# Patient Record
Sex: Female | Born: 1995 | Race: Black or African American | Hispanic: No | Marital: Single | Smoking: Never smoker
Health system: Southern US, Community
[De-identification: ages and names within clinical notes are randomized; demographics above are authoritative.]

## PROBLEM LIST (undated history)

## (undated) HISTORY — PX: DILATION AND CURETTAGE OF UTERUS: SHX78

---
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
## 2017-05-03 NOTE — L&D Delivery Note (Addendum)
  At  a viable female was delivered via .  Presentation: vertex; Position: Left,, Occiput,, Anterior;  Delivery of the head: 1443  ,  suprapubic pressure   APGAR:8/9 , ; weight  .  Pending   Dr. Dion BodyVarnado in the room for the rest of the delivery including for placenta and repair.   Upon my arrival, pt was doing well, comfortable.  Holding crying baby boy. Placenta status: intact, spontaneous .   Cord:  with the following complications:NOne .  Cord pH: NA  Anesthesia:  Lidocaine less then 20 ml Episiotomy:  None Lacerations:  3rd degree Suture Repair: 0.0 Vicryl, 2.0/3.0 Chromic Est. Blood Loss (mL):  See note.  Less than 500  3rd degree laceration repaired with Allis clamps and a series of interrupted sutures.  Vaginal repair done in 2 layers.  Digital rectal exam done before and after repair was normal.  No mucosal interruption.  Mom to postpartum.  Baby to Couplet care / Skin to Skin.  Thressa ShellerHeather Hogan 10/28/2017, 3:41 PM   Cosigned Geryl RankinsEvelyn Lyrical Sowle, MD 11/07/2017, 2:10 PM

## 2017-10-28 ENCOUNTER — Encounter (HOSPITAL_COMMUNITY): Payer: Self-pay | Admitting: *Deleted

## 2017-10-28 ENCOUNTER — Inpatient Hospital Stay (HOSPITAL_COMMUNITY)
Admission: AD | Admit: 2017-10-28 | Discharge: 2017-10-30 | DRG: 768 | Disposition: A | Discharge status: Home / Self Care | Payer: 59 | Source: Ambulatory Visit | Attending: Obstetrics and Gynecology | Admitting: Obstetrics and Gynecology

## 2017-10-28 ENCOUNTER — Other Ambulatory Visit: Payer: Self-pay

## 2017-10-28 DIAGNOSIS — O9902 Anemia complicating childbirth: Secondary | ICD-10-CM | POA: Diagnosis not present

## 2017-10-28 DIAGNOSIS — Z3403 Encounter for supervision of normal first pregnancy, third trimester: Secondary | ICD-10-CM | POA: Diagnosis present

## 2017-10-28 DIAGNOSIS — D649 Anemia, unspecified: Secondary | ICD-10-CM | POA: Diagnosis not present

## 2017-10-28 DIAGNOSIS — Z3A38 38 weeks gestation of pregnancy: Secondary | ICD-10-CM

## 2017-10-28 LAB — CBC
HEMATOCRIT: 40.5 % (ref 36.0–46.0)
Hemoglobin: 13.3 g/dL (ref 12.0–15.0)
MCH: 30.9 pg (ref 26.0–34.0)
MCHC: 32.8 g/dL (ref 30.0–36.0)
MCV: 94 fL (ref 78.0–100.0)
PLATELETS: 185 10*3/uL (ref 150–400)
RBC: 4.31 MIL/uL (ref 3.87–5.11)
RDW: 13.8 % (ref 11.5–15.5)
WBC: 10.3 10*3/uL (ref 4.0–10.5)

## 2017-10-28 LAB — TYPE AND SCREEN
ABO/RH(D): O POS
ANTIBODY SCREEN: NEGATIVE

## 2017-10-28 LAB — ABO/RH: ABO/RH(D): O POS

## 2017-10-28 MED ORDER — SENNOSIDES-DOCUSATE SODIUM 8.6-50 MG PO TABS
2.0000 | ORAL_TABLET | ORAL | Status: DC
  Administered 2017-10-28 – 2017-10-29 (×2): 2 via ORAL
  Filled 2017-10-28 (×2): qty 2

## 2017-10-28 MED ORDER — FENTANYL 2.5 MCG/ML BUPIVACAINE 1/10 % EPIDURAL INFUSION (WH - ANES)
14.0000 mL/h | INTRAMUSCULAR | Status: DC | PRN
  Filled 2017-10-28: qty 100

## 2017-10-28 MED ORDER — LACTATED RINGERS IV SOLN: 500.0000 mL | Freq: Once | INTRAVENOUS | Status: DC

## 2017-10-28 MED ORDER — EPHEDRINE 5 MG/ML INJ
10.0000 mg | INTRAVENOUS | Status: DC | PRN
  Filled 2017-10-28: qty 2

## 2017-10-28 MED ORDER — ZOLPIDEM TARTRATE 5 MG PO TABS: 5.0000 mg | ORAL_TABLET | Freq: Every evening | ORAL | Status: DC | PRN

## 2017-10-28 MED ORDER — OXYCODONE HCL 5 MG PO TABS
5.0000 mg | ORAL_TABLET | ORAL | Status: DC | PRN
  Administered 2017-10-29 (×3): 5 mg via ORAL
  Filled 2017-10-28 (×3): qty 1

## 2017-10-28 MED ORDER — BENZOCAINE-MENTHOL 20-0.5 % EX AERO
1.0000 "application " | INHALATION_SPRAY | CUTANEOUS | Status: DC | PRN
  Administered 2017-10-28: 1 via TOPICAL
  Filled 2017-10-28 (×2): qty 56

## 2017-10-28 MED ORDER — OXYTOCIN BOLUS FROM INFUSION
500.0000 mL | Freq: Once | INTRAVENOUS | Status: AC
  Administered 2017-10-28: 500 mL via INTRAVENOUS

## 2017-10-28 MED ORDER — ACETAMINOPHEN 325 MG PO TABS: 650.0000 mg | ORAL_TABLET | ORAL | Status: DC | PRN

## 2017-10-28 MED ORDER — ONDANSETRON HCL 4 MG/2ML IJ SOLN: 4.0000 mg | Freq: Four times a day (QID) | INTRAMUSCULAR | Status: DC | PRN

## 2017-10-28 MED ORDER — ALBUTEROL SULFATE (2.5 MG/3ML) 0.083% IN NEBU: 3.0000 mL | INHALATION_SOLUTION | Freq: Four times a day (QID) | RESPIRATORY_TRACT | Status: DC | PRN

## 2017-10-28 MED ORDER — LACTATED RINGERS IV SOLN
INTRAVENOUS | Status: DC
  Administered 2017-10-28: 13:00:00 via INTRAVENOUS

## 2017-10-28 MED ORDER — COCONUT OIL OIL: 1.0000 "application " | TOPICAL_OIL | Status: DC | PRN

## 2017-10-28 MED ORDER — TETANUS-DIPHTH-ACELL PERTUSSIS 5-2.5-18.5 LF-MCG/0.5 IM SUSP: 0.5000 mL | Freq: Once | INTRAMUSCULAR | Status: DC

## 2017-10-28 MED ORDER — PHENYLEPHRINE 40 MCG/ML (10ML) SYRINGE FOR IV PUSH (FOR BLOOD PRESSURE SUPPORT)
80.0000 ug | PREFILLED_SYRINGE | INTRAVENOUS | Status: DC | PRN
  Filled 2017-10-28: qty 5

## 2017-10-28 MED ORDER — PHENYLEPHRINE 40 MCG/ML (10ML) SYRINGE FOR IV PUSH (FOR BLOOD PRESSURE SUPPORT)
80.0000 ug | PREFILLED_SYRINGE | INTRAVENOUS | Status: DC | PRN
  Filled 2017-10-28: qty 10
  Filled 2017-10-28: qty 5

## 2017-10-28 MED ORDER — METHYLERGONOVINE MALEATE 0.2 MG PO TABS: 0.2000 mg | ORAL_TABLET | ORAL | Status: DC | PRN

## 2017-10-28 MED ORDER — LACTATED RINGERS IV SOLN: 500.0000 mL | INTRAVENOUS | Status: DC | PRN

## 2017-10-28 MED ORDER — DIBUCAINE 1 % RE OINT: 1.0000 "application " | TOPICAL_OINTMENT | RECTAL | Status: DC | PRN

## 2017-10-28 MED ORDER — OXYCODONE-ACETAMINOPHEN 5-325 MG PO TABS: 2.0000 | ORAL_TABLET | ORAL | Status: DC | PRN

## 2017-10-28 MED ORDER — METHYLERGONOVINE MALEATE 0.2 MG/ML IJ SOLN: 0.2000 mg | INTRAMUSCULAR | Status: DC | PRN

## 2017-10-28 MED ORDER — DIPHENHYDRAMINE HCL 50 MG/ML IJ SOLN: 12.5000 mg | INTRAMUSCULAR | Status: DC | PRN

## 2017-10-28 MED ORDER — DIPHENHYDRAMINE HCL 25 MG PO CAPS: 25.0000 mg | ORAL_CAPSULE | Freq: Four times a day (QID) | ORAL | Status: DC | PRN

## 2017-10-28 MED ORDER — FENTANYL CITRATE (PF) 100 MCG/2ML IJ SOLN
50.0000 ug | INTRAMUSCULAR | Status: DC | PRN
  Administered 2017-10-28: 50 ug via INTRAVENOUS
  Filled 2017-10-28: qty 2

## 2017-10-28 MED ORDER — OXYTOCIN 40 UNITS IN LACTATED RINGERS INFUSION - SIMPLE MED
2.5000 [IU]/h | INTRAVENOUS | Status: DC
  Filled 2017-10-28: qty 1000

## 2017-10-28 MED ORDER — SOD CITRATE-CITRIC ACID 500-334 MG/5ML PO SOLN: 30.0000 mL | ORAL | Status: DC | PRN

## 2017-10-28 MED ORDER — WITCH HAZEL-GLYCERIN EX PADS: 1.0000 "application " | MEDICATED_PAD | CUTANEOUS | Status: DC | PRN

## 2017-10-28 MED ORDER — OXYCODONE HCL 5 MG PO TABS: 10.0000 mg | ORAL_TABLET | ORAL | Status: DC | PRN

## 2017-10-28 MED ORDER — MAGNESIUM HYDROXIDE 400 MG/5ML PO SUSP: 30.0000 mL | ORAL | Status: DC | PRN

## 2017-10-28 MED ORDER — IBUPROFEN 600 MG PO TABS
600.0000 mg | ORAL_TABLET | Freq: Four times a day (QID) | ORAL | Status: DC
  Administered 2017-10-28 – 2017-10-30 (×7): 600 mg via ORAL
  Filled 2017-10-28 (×7): qty 1

## 2017-10-28 MED ORDER — ONDANSETRON HCL 4 MG/2ML IJ SOLN: 4.0000 mg | INTRAMUSCULAR | Status: DC | PRN

## 2017-10-28 MED ORDER — ACETAMINOPHEN 325 MG PO TABS
650.0000 mg | ORAL_TABLET | ORAL | Status: DC | PRN
  Administered 2017-10-28 – 2017-10-29 (×3): 650 mg via ORAL
  Filled 2017-10-28 (×3): qty 2

## 2017-10-28 MED ORDER — LIDOCAINE HCL (PF) 1 % IJ SOLN
30.0000 mL | INTRAMUSCULAR | Status: DC | PRN
  Filled 2017-10-28: qty 30

## 2017-10-28 MED ORDER — OXYCODONE-ACETAMINOPHEN 5-325 MG PO TABS
1.0000 | ORAL_TABLET | ORAL | Status: DC | PRN
  Administered 2017-10-28: 1 via ORAL
  Filled 2017-10-28: qty 1

## 2017-10-28 MED ORDER — PRENATAL MULTIVITAMIN CH
1.0000 | ORAL_TABLET | Freq: Every day | ORAL | Status: DC
  Administered 2017-10-29 – 2017-10-30 (×2): 1 via ORAL
  Filled 2017-10-28: qty 1

## 2017-10-28 MED ORDER — FLEET ENEMA 7-19 GM/118ML RE ENEM: 1.0000 | ENEMA | RECTAL | Status: DC | PRN

## 2017-10-28 MED ORDER — ONDANSETRON HCL 4 MG PO TABS: 4.0000 mg | ORAL_TABLET | ORAL | Status: DC | PRN

## 2017-10-28 MED ORDER — SIMETHICONE 80 MG PO CHEW: 80.0000 mg | CHEWABLE_TABLET | ORAL | Status: DC | PRN

## 2017-10-28 MED ORDER — BUTORPHANOL TARTRATE 1 MG/ML IJ SOLN
1.0000 mg | INTRAMUSCULAR | Status: DC | PRN
  Administered 2017-10-28: 1 mg via INTRAVENOUS
  Filled 2017-10-28: qty 1

## 2017-10-28 NOTE — H&P (Signed)
Gloria Estes is a 22 y.o. female presenting for  Labor. Pt presented in labor.  [facility]  Reports pain as severe.  PNC with [facility] (Dr. Charlotta Newtonzan) has been uncomplicated.  OB History    Gravida  1   Para      Term      Preterm      AB      Living        SAB      TAB      Ectopic      Multiple      Live Births             History reviewed. No pertinent past medical history. History reviewed. No pertinent surgical history. Family History: family history is not on file. Social History:  reports that she has never smoked. She has never used smokeless tobacco. She reports that she does not drink alcohol. Her drug history is not on file.  ROS  Abdominal pain. No bleeding  History Dilation: 1.5 Effacement (%): 70 Station: -2 Exam by:: Vira BlancoShimica Robinson RN  Blood pressure 112/69, pulse 71, temperature 97.6 F (36.4 C), temperature source Oral, resp. rate 18, weight 64.9 kg (143 lb), SpO2 98 %. Exam  Cat I tracing irregular contractions  Physical Exam  Pt with moderate to severe pain with contractions. Abd Gravid, nontender.  leopolds less than 7 pounds. Ext:  No calf tenderness Cervix deferred.  Prenatal labs: ABO, Rh:   Antibody:   Rubella:   RPR:    HBsAg:    HIV:    GBS:     Assessment/Plan: IUP @ term Active labor Epidural per anesthesia upon pt request. Expectant management.   Geryl Rankinsvelyn Larron Armor 10/28/2017, 1:03 PM

## 2017-10-28 NOTE — Progress Notes (Signed)
Phone call to Dr Idamae SchullerVarnardo.  Notification of pt presenting complaint. G1/SROM/38.4/ 1cm.  Orders for admission to birthing suites

## 2017-10-28 NOTE — MAU Note (Signed)
Started leaking clear fluid around 10, still coming. No bleeding.little blood in with mucous the morning.   Is contracting.  Was closed when last checked.

## 2017-10-29 LAB — CBC
HCT: 29.3 % — ABNORMAL LOW (ref 36.0–46.0)
Hemoglobin: 10 g/dL — ABNORMAL LOW (ref 12.0–15.0)
MCH: 31.6 pg (ref 26.0–34.0)
MCHC: 34.1 g/dL (ref 30.0–36.0)
MCV: 92.7 fL (ref 78.0–100.0)
PLATELETS: 171 10*3/uL (ref 150–400)
RBC: 3.16 MIL/uL — AB (ref 3.87–5.11)
RDW: 13.7 % (ref 11.5–15.5)
WBC: 13.4 10*3/uL — ABNORMAL HIGH (ref 4.0–10.5)

## 2017-10-29 LAB — RPR: RPR Ser Ql: NONREACTIVE

## 2017-10-29 NOTE — Lactation Note (Signed)
This note was copied from a baby's chart. Lactation Consultation Note Baby 13 hrs old. Baby latched onto breast in cradle position.good latch noted. Wide flange. Encouraged mom to have cheeks to breast. Mom stated that she has trouble latching to her Rt. Breast.  Mom has cone shaped breast w/bulbous areola and everted nipples.  Asked mom if she would like LC to assist to other breast, mom stated yes. Demonstrated unlatching, noted everted lip stick shape nipple. Assisted to Rt. Breast. Noted Rt. Breast tissue slightly less than Lt. Both has colostrum w/hand expression. Mom stated her hand gets tired of holding baby's head. Placed folded baby blanket under hand.  Discussed body alignment, positions, support, breast massage, STS, I&O, cluster feeding, supply and demand. Newborn behavior discussed. Mom encouraged to feed baby 8-12 times/24 hours and with feeding cues.  Encouraged to call for assistance or questions.  WH/LC brochure given w/resources, support groups and LC services.  Patient Name: Gloria Kathyrn DrownRicki Kunda ZOXWR'UToday's Date: 10/29/2017 Reason for consult: Initial assessment;Early term 37-38.6wks   Maternal Data Has patient been taught Hand Expression?: Yes Does the patient have breastfeeding experience prior to this delivery?: No  Feeding Feeding Type: Breast Fed Length of feed: 15 min(still BF)  LATCH Score Latch: Grasps breast easily, tongue down, lips flanged, rhythmical sucking.  Audible Swallowing: Spontaneous and intermittent  Type of Nipple: Everted at rest and after stimulation  Comfort (Breast/Nipple): Soft / non-tender  Hold (Positioning): Assistance needed to correctly position infant at breast and maintain latch.  LATCH Score: 9  Interventions Interventions: Breast feeding basics reviewed;Support pillows;Assisted with latch;Position options;Skin to skin;Breast massage;Hand express;Breast compression;Adjust position  Lactation Tools Discussed/Used WIC Program:  Yes([place]   Consult Status Consult Status: Follow-up Date: 10/30/17 Follow-up type: In-patient    Charyl DancerCARVER, Sincerity Cedar G 10/29/2017, 4:31 AM

## 2017-10-29 NOTE — Progress Notes (Signed)
Post Partum Day 1 Subjective: no complaints, up ad lib, voiding, tolerating PO and breast feeding.    Mother and baby are bonding well. Patient reports bleeding is minimal  Objective: Blood pressure 103/67, pulse (!) 58, temperature 97.8 F (36.6 C), temperature source Oral, resp. rate 18, weight 143 lb (64.9 kg), SpO2 100 %.  Physical Exam:  General: alert, cooperative and no distress Lochia: appropriate Uterine Fundus: firm Perineum:  healing well DVT Evaluation: No evidence of DVT seen on physical exam. Negative Homan's sign.  Recent Labs    10/28/17 1237 10/29/17 0523  HGB 13.3 10.0*  HCT 40.5 29.3*    Assessment/Plan: Plan for discharge tomorrow, Breastfeeding and Circumcision done today Continue routine post partum care.    LOS: 1 day   Dionisia Pacholski STACIA 10/29/2017, 9:24 AM

## 2017-10-30 MED ORDER — FERROUS SULFATE 325 (65 FE) MG PO TABS: 325.0000 mg | ORAL_TABLET | Freq: Two times a day (BID) | ORAL | 3 refills | Status: DC

## 2017-10-30 MED ORDER — IBUPROFEN 600 MG PO TABS: 600.0000 mg | ORAL_TABLET | Freq: Four times a day (QID) | ORAL | 0 refills | Status: DC

## 2017-10-30 NOTE — Lactation Note (Signed)
This note was copied from a baby's chart. Lactation Consultation Note  Patient Name: Gloria Kathyrn DrownRicki Estes ZOXWR'UToday's Date: 10/30/2017    P1, Baby 44 hours old and had large stool this morning. Baby at the end of feeding [facility] for 10 min. Attempted latching in cross cradle hold but baby would not wake.  Reviewed hand expression with mother w/ good flow of breastmilk. Mom encouraged to feed baby 8-12 times/24 hours and with feeding cues.  Reviewed waking techniques.  Encouraged mother to breastfeed on both breasts per feeding if baby is willing. Reviewed engorgement care and monitoring voids/stools.      Maternal Data    Feeding Feeding Type: Breast Fed Length of feed: 10 min  LATCH Score                   Interventions    Lactation Tools Discussed/Used     Consult Status      Hardie PulleyBerkelhammer, Gloria Estes 10/30/2017, 11:02 AM

## 2017-10-30 NOTE — Lactation Note (Signed)
This note was copied from a baby's chart. Lactation Consultation Note Baby 39 hrs old had several voids but no stool. Baby is cluster feeding, mom's breast starting to fill. Mom can easily express colostrum. Baby has circumcision yesterday. LC massage abd. To try to get baby to has stool. Nothing noted. Mom stated baby is fussy, passing some gas.  Encouraged breast massage during feeding.  Discussed feeding positions, filling, milk storage, engorgement, prevention, supply and demand. Explained to mom baby has to have stool before d/c home.   Patient Name: Gloria Estes WUJWJ'XToday's Date: 10/30/2017 Reason for consult: Follow-up assessment;Early term 37-38.6wks   Maternal Data    Feeding Feeding Type: Breast Fed Length of feed: 30 min  LATCH Score Latch: Grasps breast easily, tongue down, lips flanged, rhythmical sucking.  Audible Swallowing: Spontaneous and intermittent  Type of Nipple: Everted at rest and after stimulation  Comfort (Breast/Nipple): Soft / non-tender  Hold (Positioning): No assistance needed to correctly position infant at breast.  LATCH Score: 10  Interventions Interventions: Breast feeding basics reviewed;Support pillows;Breast massage  Lactation Tools Discussed/Used     Consult Status Consult Status: Follow-up Date: 10/30/17 Follow-up type: In-patient    Gloria Estes, Diamond NickelLAURA Estes 10/30/2017, 5:50 AM

## 2017-10-30 NOTE — Discharge Summary (Signed)
SVD OB Discharge Summary [facility]      Patient Name: Gloria Estes DOB: 1996/04/26 MRN: [id number]  Date of admission: 10/28/2017 Delivering MD: Thressa Sheller D  Date of delivery: 10/28/2017 Type of delivery: SVD  Newborn Data: Sex: BB Name: ??? Circumcision: Done Live born female  Birth Weight: 7 lb 9.3 oz (3440 g) APGAR: 8, 9  Newborn Delivery   Birth date/time:  10/28/2017 14:44:00 Delivery type:  Vaginal, Spontaneous     Feeding: breast Infant being discharge to home with mother in stable condition.   Admitting diagnosis: LABOR Intrauterine pregnancy: [redacted]w[redacted]d     Secondary diagnosis:  Active Problems:   Normal labor   Normal postpartum course                                Complications: None                                                              Intrapartum Procedures: spontaneous vaginal delivery and Supra pubic pressure used to deliver shoulder Postpartum Procedures: anemia Complications-Operative and Postpartum: 3rd degree perineal laceration repaired with local anesthesia  Augmentation: none   History of Present Illness: [name] is a 22 y.o. female, G1P0, who presents at [redacted]w[redacted]d weeks gestation. The patient has been followed at  [facility]  Her pregnancy has been complicated by: Patient Active Problem List   Diagnosis Date Noted  . Normal postpartum course 10/30/2017  . Normal labor 10/28/2017  Anemia  Hospital course:  Onset of Labor With Vaginal Delivery     22 y.o. yo G1P0 at [redacted]w[redacted]d was admitted in Latent Labor on 10/28/2017. Patient had an uncomplicated labor course as follows:  Membrane Rupture Time/Date: 9:45 AM ,10/28/2017   Intrapartum Procedures: Episiotomy: None [1]                                         Lacerations:  3rd degree [4]  Patient had a delivery of a Viable infant. 10/28/2017  Information for the patient's newborn:  Kamilah, Correia [[id number]]  Delivery Method: Vag-Spont     Pateint had an uncomplicated postpartum course.  She is ambulating, tolerating a regular diet, passing flatus, and urinating well. Patient is discharged home in stable condition on 10/30/17.  Postpartum Day # 2 : S/P NSVD due to Prom then spontaneous labor. Patient up ad lib, denies syncope or dizziness. Reports consuming regular diet without issues and denies N/V. Patient reports 0 bowel movement + passing flatus.  Denies issues with urination and reports bleeding is "light."  Patient is Breastfeeding and reports going well.  Desires undecided for postpartum contraception.  Pain is being appropriately managed with use of motrin.   Physical exam  Vitals:   10/29/17 0501 10/29/17 1549 10/29/17 2321 10/30/17 0633  BP: 103/67 121/72 116/65 (!) 103/51  Pulse: (!) 58 78 66 (!) 55  Resp: 18 18  18   Temp: 97.8 F (36.6 C) (!) 97.5 F (36.4 C) 98.6 F (37 C) 98.6 F (37 C)  TempSrc: Oral Oral Oral Oral  SpO2:  Weight:       General: alert, cooperative and no distress Lochia: appropriate Uterine Fundus: firm Perineum: 3rd degree, approximate, no erythema, healing well, no drainage DVT Evaluation: No evidence of DVT seen on physical exam. Negative Homan's sign. No cords or calf tenderness. No significant calf/ankle edema.  Labs: Lab Results  Component Value Date   WBC 13.4 (H) 10/29/2017   HGB 10.0 (L) 10/29/2017   HCT 29.3 (L) 10/29/2017   MCV 92.7 10/29/2017   PLT 171 10/29/2017   No flowsheet data found.  Date of discharge: 10/30/2017 Discharge Diagnoses: Term Pregnancy-delivered and anemia Discharge instruction: per After Visit Summary and "Baby and Me Booklet".  After visit meds:  Allergies as of 10/30/2017   No Known Allergies     Medication List    TAKE these medications   albuterol 108 (90 Base) MCG/ACT inhaler Commonly known as:  PROVENTIL HFA;VENTOLIN HFA Inhale 1 puff into the lungs every 6 (six) hours as needed for wheezing or shortness of breath.    ferrous fumarate 325 (106 Fe) MG Tabs tablet Commonly known as:  HEMOCYTE - 106 mg FE Take 1 tablet by mouth daily.   ferrous sulfate 325 (65 FE) MG tablet Take 1 tablet (325 mg total) by mouth 2 (two) times daily with a meal.   ibuprofen 600 MG tablet Commonly known as:  ADVIL,MOTRIN Take 1 tablet (600 mg total) by mouth every 6 (six) hours.   methocarbamol 500 MG tablet Commonly known as:  ROBAXIN Take 2 tablets (1,000 mg total) by mouth 4 (four) times daily as needed (Pain).       Activity:           pelvic rest Advance as tolerated. Pelvic rest for 6 weeks.  Diet:                routine Medications: PNV, Ibuprofen, Colace and Iron Postpartum contraception: Undecided Condition:  Pt discharge to home with baby in stable Anemia: Take Iron BID  Meds: Allergies as of 10/30/2017   No Known Allergies     Medication List    TAKE these medications   albuterol 108 (90 Base) MCG/ACT inhaler Commonly known as:  PROVENTIL HFA;VENTOLIN HFA Inhale 1 puff into the lungs every 6 (six) hours as needed for wheezing or shortness of breath.   ferrous fumarate 325 (106 Fe) MG Tabs tablet Commonly known as:  HEMOCYTE - 106 mg FE Take 1 tablet by mouth daily.   ferrous sulfate 325 (65 FE) MG tablet Take 1 tablet (325 mg total) by mouth 2 (two) times daily with a meal.   ibuprofen 600 MG tablet Commonly known as:  ADVIL,MOTRIN Take 1 tablet (600 mg total) by mouth every 6 (six) hours.   methocarbamol 500 MG tablet Commonly known as:  ROBAXIN Take 2 tablets (1,000 mg total) by mouth 4 (four) times daily as needed (Pain).       Discharge Follow Up:  Follow-up Information    [facility] Follow up in 6 week(s).   Specialty:  Obstetrics and Gynecology Contact information: [address] [place] [phone]           New WellsJade Gaelle Adriance, NP-C, CNM 10/30/2017, 11:19 AM  Dale DurhamMONTANA, Xara Paulding, FNP

## 2018-01-30 ENCOUNTER — Encounter (HOSPITAL_COMMUNITY): Payer: Self-pay

## 2019-03-20 ENCOUNTER — Encounter: Payer: Self-pay | Admitting: Emergency Medicine

## 2019-03-20 ENCOUNTER — Ambulatory Visit
Admission: EM | Admit: 2019-03-20 | Discharge: 2019-03-20 | Disposition: A | Discharge status: Home / Self Care | Payer: 59 | Attending: Physician Assistant | Admitting: Physician Assistant

## 2019-03-20 ENCOUNTER — Other Ambulatory Visit: Payer: Self-pay

## 2019-03-20 DIAGNOSIS — U071 COVID-19: Secondary | ICD-10-CM | POA: Diagnosis not present

## 2019-03-20 DIAGNOSIS — R112 Nausea with vomiting, unspecified: Secondary | ICD-10-CM | POA: Diagnosis not present

## 2019-03-20 LAB — POC SARS CORONAVIRUS 2 AG -  ED: SARS Coronavirus 2 Ag: POSITIVE — AB

## 2019-03-20 LAB — POCT INFLUENZA A/B
Influenza A, POC: NEGATIVE
Influenza B, POC: NEGATIVE

## 2019-03-20 MED ORDER — ONDANSETRON 4 MG PO TBDP: 4.0000 mg | ORAL_TABLET | Freq: Three times a day (TID) | ORAL | 0 refills | Status: DC | PRN

## 2019-03-20 MED ORDER — SODIUM CHLORIDE 0.9 % IV BOLUS
1000.0000 mL | Freq: Once | INTRAVENOUS | Status: AC
  Administered 2019-03-20: 19:00:00 1000 mL via INTRAVENOUS

## 2019-03-20 MED ORDER — ONDANSETRON HCL 4 MG/2ML IJ SOLN
4.0000 mg | Freq: Once | INTRAMUSCULAR | Status: AC
  Administered 2019-03-20: 19:00:00 4 mg via INTRAVENOUS

## 2019-03-20 NOTE — ED Notes (Signed)
Patient able to ambulate independently  

## 2019-03-20 NOTE — ED Provider Notes (Signed)
[name]    CSN: [id number] Arrival date & time: 03/20/19  1753      History   Chief Complaint Chief Complaint  Patient presents with  . Flu-Like Symptoms    HPI Gloria Estes is a 23 y.o. female.   23 year old female comes in for 3 day history of URI symptoms. Had positive exposure to COVID 10 days ago. Cough, rhinorrhea/nasal congestion, sinus pressure. Fever, tmax 103.5, taking tylenol/motrin. Has had chills, body aches. Felt slightly short of breath yesterday that has resolved. States loss of taste/smell, though not sure if its due to nasal congestion. Nausea with NBNB vomit with oral intake. Denies abdominal pain. Multiple episodes of diarrhea without blood. Had dizziness that is worse with ambulation. Denies syncope.      History reviewed. No pertinent past medical history.  Patient Active Problem List   Diagnosis Date Noted  . Normal postpartum course 10/30/2017  . Normal labor 10/28/2017    History reviewed. No pertinent surgical history.  OB History    Gravida  1   Para      Term      Preterm      AB      Living        SAB      TAB      Ectopic      Multiple      Live Births               Home Medications    Prior to Admission medications   Medication Sig Start Date End Date Taking? Authorizing Provider  ferrous sulfate 325 (65 FE) MG tablet Take 1 tablet (325 mg total) by mouth 2 (two) times daily with a meal. 10/30/17 10/30/18  [place], Luvenia Starch, FNP  ondansetron (ZOFRAN ODT) 4 MG disintegrating tablet Take 1 tablet (4 mg total) by mouth every 8 (eight) hours as needed for nausea or vomiting. 03/20/19   Tasia Catchings, Nakeitha Milligan V, PA-C  albuterol (PROVENTIL HFA;VENTOLIN HFA) 108 (90 Base) MCG/ACT inhaler Inhale 1 puff into the lungs every 6 (six) hours as needed for wheezing or shortness of breath.  03/20/19  [provider]  ferrous fumarate (HEMOCYTE - 106 MG FE) 325 (106 FE) MG TABS tablet Take 1 tablet by mouth daily.    03/20/19  [provider]    Family History Family History  Problem Relation Age of Onset  . Healthy Mother   . Healthy Father     Social History Social History   Tobacco Use  . Smoking status: Never Smoker  . Smokeless tobacco: Never Used  Substance Use Topics  . Alcohol use: No  . Drug use: Not on file     Allergies   Patient has no known allergies.   Review of Systems Review of Systems  Reason unable to perform ROS: See HPI as above.     Physical Exam Triage Vital Signs ED Triage Vitals  Enc Vitals Group     BP 03/20/19 1808 118/66     Pulse Rate 03/20/19 1808 (!) 110     Resp 03/20/19 1808 18     Temp 03/20/19 1808 (!) 102.6 F (39.2 C)     Temp Source 03/20/19 1808 Temporal     SpO2 03/20/19 1808 99 %     Weight --      Height --      Head Circumference --      Peak Flow --      Pain  Score 03/20/19 1812 7     Pain Loc --      Pain Edu? --      Excl. in GC? --    No data found.  Updated Vital Signs BP 118/66 (BP Location: Left Arm)   Pulse 91   Temp (!) 102.6 F (39.2 C) (Temporal) Comment: Tylenol at 445  Resp 18   LMP 02/22/2019   SpO2 99%   Physical Exam Constitutional:      General: She is not in acute distress.    Appearance: She is well-developed. She is not ill-appearing, toxic-appearing or diaphoretic.  HENT:     Head: Normocephalic and atraumatic.  Eyes:     Conjunctiva/sclera: Conjunctivae normal.     Pupils: Pupils are equal, round, and reactive to light.  Cardiovascular:     Rate and Rhythm: Regular rhythm. Tachycardia present.     Heart sounds: Normal heart sounds. No murmur. No friction rub. No gallop.   Pulmonary:     Effort: Pulmonary effort is normal.     Breath sounds: Normal breath sounds. No wheezing or rales.  Abdominal:     General: Bowel sounds are normal.     Palpations: Abdomen is soft.     Tenderness: There is no abdominal tenderness. There is no right CVA tenderness, left CVA tenderness, guarding  or rebound.  Skin:    General: Skin is warm and dry.  Neurological:     Mental Status: She is alert and oriented to person, place, and time.  Psychiatric:        Behavior: Behavior normal.        Judgment: Judgment normal.      UC Treatments / Results  Labs (all labs ordered are listed, but only abnormal results are displayed) Labs Reviewed  POC SARS CORONAVIRUS 2 ED - Abnormal; Notable for the following components:      Result Value   SARS Coronavirus 2 Ag Positive (*)    All other components within normal limits  POCT INFLUENZA A/B - Normal    EKG   Radiology No results found.  Procedures Procedures (including critical care time)  Medications Ordered in UC Medications  ondansetron (ZOFRAN) injection 4 mg (4 mg Intravenous Given 03/20/19 1848)  sodium chloride 0.9 % bolus 1,000 mL (0 mLs Intravenous Stopped 03/20/19 1937)    Initial Impression / Assessment and Plan / UC Course  I have reviewed the triage vital signs and the nursing notes.  Pertinent labs & imaging results that were available during my care of the patient were reviewed by me and considered in my medical decision making (see chart for details).    Patient febrile and tachycardic at triage.  Took Tylenol 2 hours prior to arrival, but does not know dosage.  At this time she is stable, nontoxic in appearance, lower suspicion for sepsis.  Given patient has been nauseous, unable to tolerate oral intake, will provide IV fluids with IV Zofran.  Will test for flu and Covid.  Rapid Covid positive.  Patient with improvement of symptoms after IV fluids, no longer tachycardic.  Will provide Zofran for nausea/vomiting.  Push fluids.  Strict return precautions given.  Patient expresses understanding and agrees to plan.  Final Clinical Impressions(s) / UC Diagnoses   Final diagnoses:  COVID-19   ED Prescriptions    Medication Sig Dispense Auth. Provider   ondansetron (ZOFRAN ODT) 4 MG disintegrating tablet Take  1 tablet (4 mg total) by mouth every 8 (eight) hours as needed  for nausea or vomiting. 20 tablet Belinda Fisher, PA-C     PDMP not reviewed this encounter.   Belinda Fisher, PA-C 03/20/19 2020

## 2019-03-20 NOTE — Discharge Instructions (Addendum)
Rapid COVID positive. Please quarantine at this time. Zofran as needed for nausea/vomiting. You can discontinue quarantine after 10 days of symptom onset AND 24 hours of no fever with improvement of symptoms. If develop shortness of breath, chest pain, trouble breathing, weakness, dizziness, go to the emergency department for further evaluation needed.   You can do ibuprofen 800mg  three times a day and tylenol 1000mg  three times a day for fever. If fever >104 despite medicine, go to the emergency department for further evaluation needed.   All family members to quarantine for 14 days. No need for testing.

## 2019-03-20 NOTE — ED Triage Notes (Signed)
Pt presents to [facility] for assessment since Saturday and after a COVID Exposure and symptoms include: loss of taste and smell, nausea, decreased appetite, diarrhea x 3 days, body aches, headaches, sinus pressure, fever, chills, cough, and dry eyes.  Tested for COVID Saturday, but prior to symptoms started.

## 2019-07-25 ENCOUNTER — Ambulatory Visit: Payer: 59 | Attending: Internal Medicine

## 2019-07-25 DIAGNOSIS — Z20822 Contact with and (suspected) exposure to covid-19: Secondary | ICD-10-CM

## 2019-07-26 LAB — SARS-COV-2, NAA 2 DAY TAT

## 2019-07-26 LAB — NOVEL CORONAVIRUS, NAA: SARS-CoV-2, NAA: NOT DETECTED

## 2019-09-18 ENCOUNTER — Ambulatory Visit
Admission: EM | Admit: 2019-09-18 | Discharge: 2019-09-18 | Disposition: A | Discharge status: Home / Self Care | Payer: 59 | Attending: Emergency Medicine | Admitting: Emergency Medicine

## 2019-09-18 DIAGNOSIS — R221 Localized swelling, mass and lump, neck: Secondary | ICD-10-CM

## 2019-09-18 MED ORDER — METHYLPREDNISOLONE SODIUM SUCC 125 MG IJ SOLR
80.0000 mg | Freq: Once | INTRAMUSCULAR | Status: AC
  Administered 2019-09-18: 80 mg via INTRAMUSCULAR

## 2019-09-18 MED ORDER — FLUTICASONE PROPIONATE 50 MCG/ACT NA SUSP: 1.0000 | Freq: Every day | NASAL | 0 refills | Status: DC

## 2019-09-18 MED ORDER — CETIRIZINE HCL 10 MG PO TABS: 10.0000 mg | ORAL_TABLET | Freq: Every day | ORAL | 0 refills | Status: DC

## 2019-09-18 NOTE — ED Triage Notes (Signed)
Pt states she is having allergic reactions to unknown for the past 3 days. Pt c/o difficulty swallowing, itching throat, and SOB episodes that goes away after taking benadryl. States has had 5 episodes in the past 3 days. States took benadryl 1hr ago and is fine now.

## 2019-09-18 NOTE — Discharge Instructions (Signed)
Today you were given steroid shot which will help with any swelling and allergic reaction. May take Benadryl at bedtime only, Claritin in the daytime. Recommend Flonase: 2 sprays in each nostril once daily. Important to follow-up with allergist for persistent or worsening symptoms. Please go to ER for worsening swelling of throat, lips, tongue, difficulty breathing, chest pain.

## 2019-09-18 NOTE — ED Provider Notes (Signed)
[name]    CSN: [id number] Arrival date & time: 09/18/19  1421      History   Chief Complaint Chief Complaint  Patient presents with  . Allergic Reaction    HPI Gloria Estes is a 24 y.o. female presenting for 3-day course of throat swelling sensation.  Patient denies change in diet, lifestyle, medications.  No recent travel, bug bite.  States her throat was itching, she has had difficulty swallowing, which made her feel like she could not breathe.  Patient denying cough, shortness of breath, chest pain.  No fever, rash.  States she took Benadryl with some relief.  Patient is concerned as she has been taking Benadryl twice daily: Somewhat nervous to discontinue this.   History reviewed. No pertinent past medical history.  Patient Active Problem List   Diagnosis Date Noted  . Normal postpartum course 10/30/2017  . Normal labor 10/28/2017    History reviewed. No pertinent surgical history.  OB History    Gravida  1   Para      Term      Preterm      AB      Living        SAB      TAB      Ectopic      Multiple      Live Births               Home Medications    Prior to Admission medications   Medication Sig Start Date End Date Taking? Authorizing Provider  cetirizine (ZYRTEC ALLERGY) 10 MG tablet Take 1 tablet (10 mg total) by mouth daily. 09/18/19   Hall-Potvin, [place], PA-C  fluticasone (FLONASE) 50 MCG/ACT nasal spray Place 1 spray into both nostrils daily. 09/18/19   Hall-Potvin, [place], PA-C  albuterol (PROVENTIL HFA;VENTOLIN HFA) 108 (90 Base) MCG/ACT inhaler Inhale 1 puff into the lungs every 6 (six) hours as needed for wheezing or shortness of breath.  03/20/19  [provider]  ferrous fumarate (HEMOCYTE - 106 MG FE) 325 (106 FE) MG TABS tablet Take 1 tablet by mouth daily.   03/20/19  [provider]    Family History Family History  Problem Relation Age of Onset  . Healthy Mother   . Healthy  Father     Social History Social History   Tobacco Use  . Smoking status: Never Smoker  . Smokeless tobacco: Never Used  Substance Use Topics  . Alcohol use: Yes    Comment: social  . Drug use: Not on file     Allergies   Patient has no known allergies.   Review of Systems As per HPI   Physical Exam Triage Vital Signs ED Triage Vitals  Enc Vitals Group     BP 09/18/19 1433 125/84     Pulse Rate 09/18/19 1433 95     Resp 09/18/19 1433 18     Temp 09/18/19 1433 98.3 F (36.8 C)     Temp Source 09/18/19 1433 Oral     SpO2 09/18/19 1433 97 %     Weight --      Height --      Head Circumference --      Peak Flow --      Pain Score 09/18/19 1434 0     Pain Loc --      Pain Edu? --      Excl. in GC? --    No data found.  Updated Vital  Signs BP 125/84 (BP Location: Left Arm)   Pulse 95   Temp 98.3 F (36.8 C) (Oral)   Resp 18   LMP 09/16/2019   SpO2 97%   Breastfeeding No   Visual Acuity Right Eye Distance:   Left Eye Distance:   Bilateral Distance:    Right Eye Near:   Left Eye Near:    Bilateral Near:     Physical Exam Constitutional:      General: She is not in acute distress. HENT:     Head: Normocephalic and atraumatic.     Mouth/Throat:     Mouth: Mucous membranes are moist.     Pharynx: Oropharynx is clear.     Comments: Uvula midline, nonedematous Eyes:     General: No scleral icterus.    Pupils: Pupils are equal, round, and reactive to light.  Neck:     Comments: Trachea midline, without stridor or crepitus.  Negative JVD.  No appreciable thyromegaly, nodules, throat swelling.   Cardiovascular:     Rate and Rhythm: Normal rate.  Pulmonary:     Effort: Pulmonary effort is normal. No respiratory distress.     Breath sounds: No wheezing.  Musculoskeletal:     Cervical back: Normal range of motion and neck supple. No tenderness.  Lymphadenopathy:     Cervical: No cervical adenopathy.  Skin:    Capillary Refill: Capillary refill  takes less than 2 seconds.     Coloration: Skin is not jaundiced or pale.     Findings: No bruising, erythema or rash.  Neurological:     Mental Status: She is alert and oriented to person, place, and time.      UC Treatments / Results  Labs (all labs ordered are listed, but only abnormal results are displayed) Labs Reviewed - No data to display  EKG   Radiology No results found.  Procedures Procedures (including critical care time)  Medications Ordered in UC Medications  methylPREDNISolone sodium succinate (SOLU-MEDROL) 125 mg/2 mL injection 80 mg (has no administration in time range)    Initial Impression / Assessment and Plan / UC Course  I have reviewed the triage vital signs and the nursing notes.  Pertinent labs & imaging results that were available during my care of the patient were reviewed by me and considered in my medical decision making (see chart for details).     Patient afebrile, nontoxic, without respiratory distress.  No obvious facial swelling, throat swelling, airway compromise at this time.  Patient given Solu-Medrol injection which she tolerated well.  Will discontinue daytime Benadryl use: Swapped out for Zyrtec, Flonase.  Discussed postnasal drip and anxiety around symptoms can be contributing to throat swelling sensation.  Provided contact information for allergy/immunology if needed.  Return precautions discussed, patient verbalized understanding and is agreeable to plan. Final Clinical Impressions(s) / UC Diagnoses   Final diagnoses:  Throat swelling     Discharge Instructions     Today you were given steroid shot which will help with any swelling and allergic reaction. May take Benadryl at bedtime only, Claritin in the daytime. Recommend Flonase: 2 sprays in each nostril once daily. Important to follow-up with allergist for persistent or worsening symptoms. Please go to ER for worsening swelling of throat, lips, tongue, difficulty breathing,  chest pain.    ED Prescriptions    Medication Sig Dispense Auth. Provider   cetirizine (ZYRTEC ALLERGY) 10 MG tablet Take 1 tablet (10 mg total) by mouth daily. 30 tablet Hall-Potvin, [place], [place]  fluticasone (FLONASE) 50 MCG/ACT nasal spray Place 1 spray into both nostrils daily. 16 g Hall-Potvin, [place], PA-C     PDMP not reviewed this encounter.   Hall-Potvin, [place], [place] 09/18/19 1451

## 2019-10-10 ENCOUNTER — Ambulatory Visit: Payer: 59 | Admitting: Nurse Practitioner

## 2019-10-10 ENCOUNTER — Other Ambulatory Visit: Payer: Self-pay

## 2019-10-10 ENCOUNTER — Encounter: Payer: Self-pay | Admitting: Nurse Practitioner

## 2019-10-10 VITALS — BP 110/64 | HR 83 | Temp 97.6°F | Ht 69.09 in | Wt 146.0 lb

## 2019-10-10 DIAGNOSIS — D500 Iron deficiency anemia secondary to blood loss (chronic): Secondary | ICD-10-CM | POA: Diagnosis not present

## 2019-10-10 DIAGNOSIS — N921 Excessive and frequent menstruation with irregular cycle: Secondary | ICD-10-CM

## 2019-10-10 DIAGNOSIS — Z3042 Encounter for surveillance of injectable contraceptive: Secondary | ICD-10-CM | POA: Insufficient documentation

## 2019-10-10 NOTE — Patient Instructions (Addendum)
Thank you for choosing [facility] for your health needs  Please sign medical release to get your records from previous GYN.  Start multivitamin with iron, 1tab daily with food. Maintain adequate oral hydration and high fiber diet to avoid constipation.  Iron-Rich Diet  Iron is a mineral that helps your body to produce hemoglobin. Hemoglobin is a protein in red blood cells that carries oxygen to your body's tissues. Eating too little iron may cause you to feel weak and tired, and it can increase your risk of infection. Iron is naturally found in many foods, and many foods have iron added to them (iron-fortified foods). You may need to follow an iron-rich diet if you do not have enough iron in your body due to certain medical conditions. The amount of iron that you need each day depends on your age, your sex, and any medical conditions you have. Follow instructions from your health care provider or a diet and nutrition specialist (dietitian) about how much iron you should eat each day. What are tips for following this plan? Reading food labels  Check food labels to see how many milligrams (mg) of iron are in each serving. Cooking  Cook foods in pots and pans that are made from iron.  Take these steps to make it easier for your body to absorb iron from certain foods: ? Soak beans overnight before cooking. ? Soak whole grains overnight and drain them before using. ? Ferment flours before baking, such as by using yeast in bread dough. Meal planning  When you eat foods that contain iron, you should eat them with foods that are high in vitamin C. These include oranges, peppers, tomatoes, potatoes, and mango. Vitamin C helps your body to absorb iron. General information  Take iron supplements only as told by your health care provider. An overdose of iron can be life-threatening. If you were prescribed iron supplements, take them with orange juice or a vitamin C supplement.  When you  eat iron-fortified foods or take an iron supplement, you should also eat foods that naturally contain iron, such as meat, poultry, and fish. Eating naturally iron-rich foods helps your body to absorb the iron that is added to other foods or contained in a supplement.  Certain foods and drinks prevent your body from absorbing iron properly. Avoid eating these foods in the same meal as iron-rich foods or with iron supplements. These foods include: ? Coffee, black tea, and red wine. ? Milk, dairy products, and foods that are high in calcium. ? Beans and soybeans. ? Whole grains. What foods should I eat? Fruits Prunes. Raisins. Eat fruits high in vitamin C, such as oranges, grapefruits, and strawberries, alongside iron-rich foods. Vegetables Spinach (cooked). Green peas. Broccoli. Fermented vegetables. Eat vegetables high in vitamin C, such as leafy greens, potatoes, bell peppers, and tomatoes, alongside iron-rich foods. Grains Iron-fortified breakfast cereal. Iron-fortified whole-wheat bread. Enriched rice. Sprouted grains. Meats and other proteins Beef liver. Oysters. Beef. Shrimp. [place]. Chicken. Tuna. Sardines. Chickpeas. Nuts. Tofu. Pumpkin seeds. Beverages Tomato juice. Fresh orange juice. Prune juice. Hibiscus tea. Fortified instant breakfast shakes. Sweets and desserts Blackstrap molasses. Seasonings and condiments Tahini. Fermented soy sauce. Other foods Wheat germ. The items listed above may not be a complete list of recommended foods and beverages. Contact a dietitian for more information. What foods should I avoid? Grains Whole grains. Bran cereal. Bran flour. Oats. Meats and other proteins Soybeans. Products made from soy protein. Black beans. Lentils. Mung beans. Split peas.  Dairy Milk. Cream. Cheese. Yogurt. Cottage cheese. Beverages Coffee. Black tea. Red wine. Sweets and desserts Cocoa. Chocolate. Ice cream. Other foods Basil. Oregano. Large amounts of  parsley. The items listed above may not be a complete list of foods and beverages to avoid. Contact a dietitian for more information. Summary  Iron is a mineral that helps your body to produce hemoglobin. Hemoglobin is a protein in red blood cells that carries oxygen to your body's tissues.  Iron is naturally found in many foods, and many foods have iron added to them (iron-fortified foods).  When you eat foods that contain iron, you should eat them with foods that are high in vitamin C. Vitamin C helps your body to absorb iron.  Certain foods and drinks prevent your body from absorbing iron properly, such as whole grains and dairy products. You should avoid eating these foods in the same meal as iron-rich foods or with iron supplements. This information is not intended to replace advice given to you by your health care provider. Make sure you discuss any questions you have with your health care provider. Document Revised: 04/01/2017 Document Reviewed: 03/15/2017 Elsevier Patient Education  2020 [employer].

## 2019-10-10 NOTE — Assessment & Plan Note (Signed)
Asymptomatic today I spent all of today's visit discussing the importance of iron supplement and need for iron rich foods due to chronic blood loss. With multiple failed therapy with OCP, implant and injection, I advised for contraception to be managed by GYN. She agreed to make diet chnages and take iron supplement 3x/week. Will obtained records from [facility] office.

## 2019-10-10 NOTE — Progress Notes (Signed)
Subjective:  Patient ID: Gloria Estes, female    DOB: [birth year]  Age: 24 y.o. MRN: [id number]  CC: Establish Care (New pt-Pt not fasting//OBGYN issues-has OBGYN-[facility]-heavy periods tested for fibroids and negative-irregular bleeding)  HPI Gloria Estes is here to discuss hx of menorrhagia, dysmenorrhea and to establish care. She reports heavy and irregular menstrual cycles since age 89. Daily use of overnight pads x 2weeks. No clots noted. She has been under the care of [facility]. Report no improvement with use of OCP, injection and implant. Current use of depoprovera injection. She also had pelvic US with no structural abnormality found per patient.She reports hx of iron deficient anemia, but unable to tolerate side effects (constipation). She admits to inadequate intake of iron rich food, but maintains adequate oral hydration. She is not interested in IV iron infusion either. She had labs completed within last 46months by GYN. She wants a second opinion on how to manage menorrhagia and dysmenorrhea.  Reviewed past Medical, Social and Family history today.  Outpatient Medications Prior to Visit  Medication Sig Dispense Refill  . medroxyPROGESTERone (DEPO-PROVERA) 150 MG/ML injection Inject 150 mg into the muscle every 3 (three) months.    . cetirizine (ZYRTEC ALLERGY) 10 MG tablet Take 1 tablet (10 mg total) by mouth daily. (Patient not taking: Reported on 10/10/2019) 30 tablet 0  . fluticasone (FLONASE) 50 MCG/ACT nasal spray Place 1 spray into both nostrils daily. (Patient not taking: Reported on 10/10/2019) 16 g 0   No facility-administered medications prior to visit.    ROS See HPI  Objective:  BP 110/64   Pulse 83   Temp 97.6 F (36.4 C) (Tympanic)   Ht 5' 9.09" (1.755 m)   Wt 146 lb (66.2 kg)   LMP 09/16/2019   SpO2 99%   BMI 21.50 kg/m   BP Readings from Last 3 Encounters:  10/10/19 110/64  09/18/19 125/84  03/20/19 118/66    Wt Readings from Last 3 Encounters:    10/10/19 146 lb (66.2 kg)  10/28/17 143 lb (64.9 kg)  01/23/15 110 lb (49.9 kg) (23 %, Z= -0.76)*   * Growth percentiles are based on CDC (Girls, 2-20 Years) data.    Physical Exam Eyes:     Extraocular Movements: Extraocular movements intact.     Conjunctiva/sclera: Conjunctivae normal.  Cardiovascular:     Rate and Rhythm: Normal rate and regular rhythm.     Pulses: Normal pulses.     Heart sounds: Normal heart sounds.  Pulmonary:     Effort: Pulmonary effort is normal.     Breath sounds: Normal breath sounds.  Abdominal:     Palpations: Abdomen is soft.  Musculoskeletal:     Right lower leg: No edema.     Left lower leg: No edema.  Skin:    General: Skin is warm and dry.  Neurological:     Mental Status: She is alert and oriented to person, place, and time.  Psychiatric:        Mood and Affect: Mood normal.        Behavior: Behavior normal.        Thought Content: Thought content normal.    Lab Results  Component Value Date   WBC 13.4 (H) 10/29/2017   HGB 10.0 (L) 10/29/2017   HCT 29.3 (L) 10/29/2017   PLT 171 10/29/2017   Assessment & Plan:  This visit occurred during the SARS-CoV-2 public health emergency.  Safety protocols were in place, including screening questions prior  to the visit, additional usage of staff PPE, and extensive cleaning of exam room while observing appropriate contact time as indicated for disinfecting solutions.   Gloria Estes was seen today for establish care.  Diagnoses and all orders for this visit:  Menorrhagia with irregular cycle -     Ambulatory referral to Gynecology  Encounter for surveillance of injectable contraceptive -     Ambulatory referral to Gynecology   I am having Gloria Estes maintain her cetirizine, fluticasone, and medroxyPROGESTERone.  No orders of the defined types were placed in this encounter.  Problem List Items Addressed This Visit    None    Visit Diagnoses    Menorrhagia with irregular cycle    -   Primary   Relevant Orders   Ambulatory referral to Gynecology   Encounter for surveillance of injectable contraceptive       Relevant Orders   Ambulatory referral to Gynecology      Follow-up: Return in about 1 year (around 10/09/2020) for CPE (fasting).  Alysia Penna, NP

## 2019-10-11 ENCOUNTER — Encounter: Payer: Self-pay | Admitting: Obstetrics and Gynecology

## 2019-10-11 ENCOUNTER — Other Ambulatory Visit: Payer: Self-pay

## 2019-10-11 ENCOUNTER — Ambulatory Visit: Payer: 59 | Admitting: Obstetrics and Gynecology

## 2019-10-11 VITALS — BP 100/60 | HR 83 | Temp 98.1°F | Ht 69.0 in | Wt 142.0 lb

## 2019-10-11 DIAGNOSIS — N921 Excessive and frequent menstruation with irregular cycle: Secondary | ICD-10-CM

## 2019-10-11 DIAGNOSIS — Z3009 Encounter for other general counseling and advice on contraception: Secondary | ICD-10-CM

## 2019-10-11 DIAGNOSIS — Z862 Personal history of diseases of the blood and blood-forming organs and certain disorders involving the immune mechanism: Secondary | ICD-10-CM | POA: Diagnosis not present

## 2019-10-11 DIAGNOSIS — Z8742 Personal history of other diseases of the female genital tract: Secondary | ICD-10-CM | POA: Diagnosis not present

## 2019-10-11 MED ORDER — ETONOGESTREL-ETHINYL ESTRADIOL 0.12-0.015 MG/24HR VA RING: 1.0000 | VAGINAL_RING | VAGINAL | 0 refills | Status: DC

## 2019-10-11 NOTE — Progress Notes (Signed)
24 y.o. G1P1001 Single Black or African American Not Hispanic or Latino female here for iregular periods. Patient states that she is bleeding most of the month rPeriod Pattern: (!) Irregular Menstrual Flow:  (Heavy for 2 weeks then tapers off then comes back for 1) Menstrual Control: Maxi pad, Tampon Menstrual Control Change Freq (Hours): 2 Dysmenorrhea: (!) Severe Dysmenorrhea Symptoms: Cramping   She started ~3months ago. She was previously on depo-provera in 2019 for 8 months, she was breast feeding, no cycle. Just occasional spotting, no real cycle for at least 6 months. Nursed for ~15 months.  Prior to pregnancy her cycle were monthly x 2 weeks or longer. She could soak a pad in 2-3 hours. Mild cramps. Was on OCP's at 18, after HS she stopped them. Even on OCP's her cycles were long, felt like she was always bleeding.  She went off depo-provera in 2/21, cycles went back to monthly x 2 weeks, + spotting. Prior to restarting the depo provera, she was having cycle monthly x 2 weeks and spotting after. She got pregnant and had a TAB in 2/21, Started on the depo provera shortly there after.  She had an ultrasound after the TAB, was told there was some tissue in her uterus, was told it should resolve. Wasn't told she had fibroids. She has had frequent blood draws, she was told her iron was very low and to take iron. She couldn't tolerate oral iron, is taking a prenatal vitamins.  With prior OCP's her cycles were lighter, but didn't like having to take a pill every day.  She also tried the nexplanon, bleed x 3 years.  Sexually active, same partner for 3 years.   Patient's last menstrual period was 09/16/2019.          Sexually active: Yes.    The current method of family planning is Depo-Provera injections.     Exercising: Yes.     Smoker:  no  Health Maintenance:  Pap:  never History of abnormal Pap:  NA TDaP:  10/09/08 Gardasil: complete   reports that she has never smoked. She has never  used smokeless tobacco. She reports current alcohol use. She reports that she does not use drugs. Just occasional ETOH. Just graduated with a degree in Psych. 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 Son is almost two.   No past medical history on file.  No past surgical history on file.  Current Outpatient Medications  Medication Sig Dispense Refill  . cetirizine (ZYRTEC ALLERGY) 10 MG tablet Take 1 tablet (10 mg total) by mouth daily. 30 tablet 0  . fluticasone (FLONASE) 50 MCG/ACT nasal spray Place 1 spray into both nostrils daily. 16 g 0  . medroxyPROGESTERone (DEPO-PROVERA) 150 MG/ML injection Inject 150 mg into the muscle every 3 (three) months.     No current facility-administered medications for this visit.    Family History  Problem Relation Age of Onset  . Healthy Mother   . Healthy Father     Review of Systems  Genitourinary: Positive for vaginal bleeding.  All other systems reviewed and are negative.   Exam:   BP 100/60   Pulse 83   Temp 98.1 F (36.7 C)   Ht 5\' 9"  (1.753 m)   Wt 142 lb (64.4 kg)   LMP 09/16/2019   SpO2 100%   BMI 20.97 kg/m   Weight change: @WEIGHTCHANGE @ Height:   Height: 5\' 9"  (175.3 cm)  Ht Readings from Last 3 Encounters:  10/11/19  5\' 9"  (1.753 m)  10/10/19 5' 9.09" (1.755 m)  01/23/15 5\' 7"  (1.702 m) (87 %, Z= 1.11)*   * Growth percentiles are based on CDC (Girls, 2-20 Years) data.    General appearance: alert, cooperative and appears stated age Neck: no adenopathy, supple, symmetrical, trachea midline and thyroid normal to inspection and palpation Abdomen: soft, non-tender; non distended,  no masses,  no organomegaly  A:  Abnormal uterine bleeding on depo provera  H/O menorrhagia  Reports recent lab work and ultrasound  P:   Get a copy of ultrasound and lab work, depending on results will make further recommendations  Will return for an annual and pap (need to figure out when her annual is due)  Will start the  nuvaring, no contraindications, risks reviewed  She is on PNV for iron (prefers not to have an iron infusion)  Menstrual calendar given   Total patient care 35 minutes

## 2019-10-11 NOTE — Patient Instructions (Signed)
Ethinyl Estradiol; Etonogestrel vaginal ring What is this medicine? ETHINYL ESTRADIOL; ETONOGESTREL (ETH in il es tra DYE ole; et oh noe JES trel) vaginal ring is a flexible, vaginal ring used as a contraceptive (birth control method). This medicine combines 2 types of female hormones, an estrogen and a progestin. This ring is used to prevent ovulation and pregnancy. Each ring is effective for 1 month. This medicine may be used for other purposes; ask your health care provider or pharmacist if you have questions. COMMON BRAND NAME(S): EluRyng, NuvaRing What should I tell my health care provider before I take this medicine? They need to know if you have any of these conditions:  abnormal vaginal bleeding  blood vessel disease or blood clots  breast, cervical, endometrial, ovarian, liver, or uterine cancer  diabetes  gallbladder disease  having surgery  heart disease or recent heart attack  high blood pressure  high cholesterol or triglycerides  history of irregular heartbeat or heart valve problems  kidney disease  liver disease  migraine headaches  protein C deficiency  protein S deficiency  recently had a baby, miscarriage, or abortion  stroke  systemic lupus erythematosus (SLE)  tobacco smoker  your age is more than 22 years old  an unusual or allergic reaction to estrogens, progestins, other medicines, foods, dyes, or preservatives  pregnant or trying to get pregnant  breast-feeding How should I use this medicine? Insert the ring into your vagina as directed. Follow the directions on the prescription label. The ring will remain place for 3 weeks and is then removed for a 1-week break. A new ring is inserted 1 week after the last ring was removed, on the same day of the week. Check often to make sure the ring is still in place. If the ring was out of the vagina for an unknown amount of time, you may not be protected from pregnancy. Perform a pregnancy test and  call your doctor. Do not use more often than directed. A patient package insert for the product will be given with each prescription and refill. Read this sheet carefully each time. The sheet may change frequently. Contact your pediatrician regarding the use of this medicine in children. Special care may be needed. Overdosage: If you think you have taken too much of this medicine contact a poison control center or emergency room at once. NOTE: This medicine is only for you. Do not share this medicine with others. What if I miss a dose? You will need to use the ring exactly as directed. It is very important to follow the schedule every cycle. If you do not use the ring as directed, you may not be protected from pregnancy. If the ring should slip out, is lost, or if you leave it in longer or shorter than you should, contact your health care professional for advice. What may interact with this medicine? Do not take this medicine with the following medications:  dasabuvir; ombitasvir; paritaprevir; ritonavir  ombitasvir; paritaprevir; ritonavir  vaginal lubricants or other vaginal products that are oil-based or silicone-based This medicine may also interact with the following medications:  acetaminophen  antibiotics or medicines for infections, especially rifampin, rifabutin, rifapentine, and griseofulvin, and possibly penicillins or tetracyclines  aprepitant or fosaprepitant  armodafinil  ascorbic acid (vitamin C)  barbiturate medicines, such as phenobarbital or primidone  bosentan  certain antiviral medicines for hepatitis, HIV or AIDS  certain medicines for cancer treatment  certain medicines for seizures like carbamazepine, clobazam, felbamate, lamotrigine, oxcarbazepine, phenytoin,   rufinamide, topiramate  certain medicines for treating high cholesterol  cyclosporine  dantrolene  elagolix  flibanserin  grapefruit juice  lesinurad  medicines for diabetes  medicines  to treat fungal infections, such as griseofulvin, miconazole, fluconazole, ketoconazole, itraconazole, posaconazole or voriconazole  mifepristone  mitotane  modafinil  morphine  mycophenolate  St. John's wort  tamoxifen  temazepam  theophylline or aminophylline  thyroid hormones  tizanidine  tranexamic acid  ulipristal  warfarin This list may not describe all possible interactions. Give your health care provider a list of all the medicines, herbs, non-prescription drugs, or dietary supplements you use. Also tell them if you smoke, drink alcohol, or use illegal drugs. Some items may interact with your medicine. What should I watch for while using this medicine? Visit your doctor or health care professional for regular checks on your progress. You will need a regular breast and pelvic exam and Pap smear while on this medicine. Check with your doctor or health care professional to see if you need an additional method of contraception during the first cycle that you use this ring. Female condoms (made with natural rubber latex, polyisoprene, and polyurethane) and spermicides may be used. Do not use a diaphragm, cervical cap, or a female condom, as the ring can interfere with these birth control methods and their proper placement. If you have any reason to think you are pregnant, stop using this medicine right away and contact your doctor or health care professional. If you are using this medicine for hormone related problems, it may take several cycles of use to see improvement in your condition. Smoking increases the risk of getting a blood clot or having a stroke while you are using hormonal birth control, especially if you are more than 22 years old. You are strongly advised not to smoke. Some women are prone to getting dark patches on the skin of the face (cholasma). Your risk of getting chloasma with this medicine is higher if you had chloasma during a pregnancy. Keep out of the  sun. If you cannot avoid being in the sun, wear protective clothing and use sunscreen. Do not use sun lamps or tanning beds/booths. This medicine can make your body retain fluid, making your fingers, hands, or ankles swell. Your blood pressure can go up. Contact your doctor or health care professional if you feel you are retaining fluid. If you are going to have elective surgery, you may need to stop using this medicine before the surgery. Consult your health care professional for advice. This medicine does not protect you against HIV infection (AIDS) or any other sexually transmitted diseases. What side effects may I notice from receiving this medicine? Side effects that you should report to your doctor or health care professional as soon as possible:  allergic reactions such as skin rash or itching, hives, swelling of the lips, mouth, tongue, or throat  depression  high blood pressure  migraines or severe, sudden headaches  signs and symptoms of a blood clot such as breathing problems; changes in vision; chest pain; severe, sudden headache; pain, swelling, warmth in the leg; trouble speaking; sudden numbness or weakness of the face, arm or leg  signs and symptoms of infection like fever or chills with dizziness and a sunburn-like rash, or pain or trouble passing urine  stomach pain  symptoms of vaginal infection like itching, irritation or unusual discharge  yellowing of the eyes or skin Side effects that usually do not require medical attention (report these to your doctor   or health care professional if they continue or are bothersome):  acne  breast pain, tenderness  irregular vaginal bleeding or spotting, particularly during the first month of use  mild headache  nausea  painful periods  vomiting This list may not describe all possible side effects. Call your doctor for medical advice about side effects. You may report side effects to FDA at 1-800-FDA-1088. Where should I  keep my medicine? Keep out of the reach of children. Store unopened medicine for up to 4 months at room temperature at 15 and 30 degrees C (59 and 86 degrees F). Protect from light. Do not store above 30 degrees C (86 degrees F). Throw away any unused medicine 4 months after the dispense date or the expiration date, whichever comes first. A ring may only be used for 1 cycle (1 month). After the 3-week cycle, a used ring is removed and should be placed in the re-closable foil pouch and discarded in the trash out of reach of children and pets. Do NOT flush down the toilet. NOTE: This sheet is a summary. It may not cover all possible information. If you have questions about this medicine, talk to your doctor, pharmacist, or health care provider.  2020 Elsevier/Gold Standard (2018-11-09 12:31:47)  

## 2020-01-30 ENCOUNTER — Encounter: Payer: Self-pay | Admitting: *Deleted

## 2020-01-30 ENCOUNTER — Other Ambulatory Visit: Payer: Self-pay

## 2020-01-30 ENCOUNTER — Ambulatory Visit
Admission: EM | Admit: 2020-01-30 | Discharge: 2020-01-30 | Disposition: A | Discharge status: Home / Self Care | Payer: 59 | Attending: Emergency Medicine | Admitting: Emergency Medicine

## 2020-01-30 DIAGNOSIS — J029 Acute pharyngitis, unspecified: Secondary | ICD-10-CM | POA: Insufficient documentation

## 2020-01-30 DIAGNOSIS — Z1152 Encounter for screening for COVID-19: Secondary | ICD-10-CM

## 2020-01-30 LAB — POCT RAPID STREP A (OFFICE): Rapid Strep A Screen: NEGATIVE

## 2020-01-30 MED ORDER — FLUTICASONE PROPIONATE 50 MCG/ACT NA SUSP: 1.0000 | Freq: Every day | NASAL | 0 refills | Status: DC

## 2020-01-30 MED ORDER — CETIRIZINE HCL 10 MG PO TABS: 10.0000 mg | ORAL_TABLET | Freq: Every day | ORAL | 0 refills | Status: DC

## 2020-01-30 NOTE — ED Provider Notes (Signed)
[name]    CSN: [id number] Arrival date & time: 01/30/20  1605      History   Chief Complaint Chief Complaint  Patient presents with  . Cough  . Sore Throat    HPI Gloria Estes is a 24 y.o. female  Presenting for 3-day course of sore throat.  Has had a mild cough associated with this from "clearing my throat".  No difficulty breathing, chest pain or palpitations, fever, known sick contacts.  Denies abdominal pain, nausea, vomiting, diarrhea.  History reviewed. No pertinent past medical history.  Patient Active Problem List   Diagnosis Date Noted  . Menorrhagia with irregular cycle 10/10/2019  . Encounter for surveillance of injectable contraceptive 10/10/2019  . 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 No pertinent surgical history.  OB History    Gravida  2   Para  1   Term  1   Preterm      AB      Living  1     SAB      TAB      Ectopic      Multiple      Live Births  1            Home Medications    Prior to Admission medications   Medication Sig Start Date End Date Taking? Authorizing Provider  etonogestrel-ethinyl estradiol (NUVARING) 0.12-0.015 MG/24HR vaginal ring Place 1 each vaginally every 28 (twenty-eight) days. Insert vaginally and leave in place for 3 consecutive weeks, then remove for 1 week. 10/11/19  Yes Romualdo Bolk, MD  cetirizine (ZYRTEC ALLERGY) 10 MG tablet Take 1 tablet (10 mg total) by mouth daily. 01/30/20   Hall-Potvin, [place], PA-C  fluticasone (FLONASE) 50 MCG/ACT nasal spray Place 1 spray into both nostrils daily. 01/30/20   Hall-Potvin, [place], PA-C  albuterol (PROVENTIL HFA;VENTOLIN HFA) 108 (90 Base) MCG/ACT inhaler Inhale 1 puff into the lungs every 6 (six) hours as needed for wheezing or shortness of breath.  03/20/19  [provider]  ferrous fumarate (HEMOCYTE - 106 MG FE) 325  (106 FE) MG TABS tablet Take 1 tablet by mouth daily.   03/20/19  [provider]  medroxyPROGESTERone (DEPO-PROVERA) 150 MG/ML injection Inject 150 mg into the muscle every 3 (three) months. 08/02/19 01/30/20  [provider]    Family History Family History  Problem Relation Age of Onset  . Healthy Mother   . Healthy Father     Social History Social History   Tobacco Use  . Smoking status: Never Smoker  . Smokeless tobacco: Never Used  Vaping Use  . Vaping Use: Never used  Substance Use Topics  . Alcohol use: Yes    Comment: social  . Drug use: Never     Allergies   Patient has no known allergies.   Review of Systems As per HPI   Physical Exam Triage Vital Signs ED Triage Vitals  Enc Vitals Group     BP 01/30/20 1619 103/66     Pulse Rate 01/30/20 1619 72     Resp 01/30/20 1619 16     Temp 01/30/20 1619 98.9 F (37.2 C)     Temp Source 01/30/20 1619 Oral     SpO2 01/30/20 1619 97 %     Weight --      Height --      Head Circumference --      Peak  Flow --      Pain Score 01/30/20 1622 9     Pain Loc --      Pain Edu? --      Excl. in GC? --    No data found.  Updated Vital Signs BP 103/66 (BP Location: Left Arm)   Pulse 72   Temp 98.9 F (37.2 C) (Oral)   Resp 16   LMP 01/16/2020   SpO2 97%   Visual Acuity Right Eye Distance:   Left Eye Distance:   Bilateral Distance:    Right Eye Near:   Left Eye Near:    Bilateral Near:     Physical Exam Constitutional:      General: She is not in acute distress.    Appearance: She is not ill-appearing or diaphoretic.  HENT:     Head: Normocephalic and atraumatic.     Mouth/Throat:     Mouth: Mucous membranes are moist.     Pharynx: Oropharynx is clear. No oropharyngeal exudate or posterior oropharyngeal erythema.  Eyes:     General: No scleral icterus.    Conjunctiva/sclera: Conjunctivae normal.     Pupils: Pupils are equal, round, and reactive to light.  Neck:     Comments:  Trachea midline, negative JVD Cardiovascular:     Rate and Rhythm: Normal rate and regular rhythm.     Heart sounds: No murmur heard.  No gallop.   Pulmonary:     Effort: Pulmonary effort is normal. No respiratory distress.     Breath sounds: No wheezing, rhonchi or rales.  Musculoskeletal:     Cervical back: Neck supple. No tenderness.  Lymphadenopathy:     Cervical: No cervical adenopathy.  Skin:    Capillary Refill: Capillary refill takes less than 2 seconds.     Coloration: Skin is not jaundiced or pale.     Findings: No rash.  Neurological:     General: No focal deficit present.     Mental Status: She is alert and oriented to person, place, and time.      UC Treatments / Results  Labs (all labs ordered are listed, but only abnormal results are displayed) Labs Reviewed  NOVEL CORONAVIRUS, NAA  CULTURE, GROUP A STREP [facility])  POCT RAPID STREP A (OFFICE)    EKG   Radiology No results found.  Procedures Procedures (including critical care time)  Medications Ordered in UC Medications - No data to display  Initial Impression / Assessment and Plan / UC Course  I have reviewed the triage vital signs and the nursing notes.  Pertinent labs & imaging results that were available during my care of the patient were reviewed by me and considered in my medical decision making (see chart for details).     Patient afebrile, nontoxic, with SpO2 97%.  Rapid strep negative, culture pending.  Covid PCR pending.  Patient to quarantine until results are back.  We will treat supportively as outlined below.  Return precautions discussed, patient verbalized understanding and is agreeable to plan. Final Clinical Impressions(s) / UC Diagnoses   Final diagnoses:  Sore throat     Discharge Instructions     Your rapid strep test was negative today.  The culture is pending.  Please look on your MyChart for test results.   We will notify you if the culture positive and outline a  treatment plan at that time.   Please continue Tylenol and/or Ibuprofen as needed for fever, pain.  May try warm salt water gargles, cepacol  lozenges, throat spray, warm tea or water with lemon/honey, or OTC cold relief medicine for throat discomfort.    For congestion: take a daily anti-histamine like Zyrtec, Claritin, and a oral decongestant to help with post nasal drip that may be irritating your throat.   It is important to stay hydrated: drink plenty of fluids (primarily water) to keep your throat moisturized and help further relieve irritation/discomfort.     ED Prescriptions    Medication Sig Dispense Auth. Provider   cetirizine (ZYRTEC ALLERGY) 10 MG tablet Take 1 tablet (10 mg total) by mouth daily. 30 tablet Hall-Potvin, [place], PA-C   fluticasone (FLONASE) 50 MCG/ACT nasal spray Place 1 spray into both nostrils daily. 16 g Hall-Potvin, [place], PA-C     PDMP not reviewed this encounter.   Hall-Potvin, [place], [place] 01/30/20 1729

## 2020-01-30 NOTE — ED Triage Notes (Signed)
Patient in with complaints of productive cough and sore throat since Sunday. Patient states sputum is yellow/green in color. Patient denies any fever. Patient states that she had difficulty breathing and swallowing due to the thick mucus. Patient states on the way to UC she had an episode of emesis after coughing.

## 2020-01-30 NOTE — Discharge Instructions (Addendum)

## 2020-01-31 LAB — SARS-COV-2, NAA 2 DAY TAT

## 2020-01-31 LAB — NOVEL CORONAVIRUS, NAA: SARS-CoV-2, NAA: NOT DETECTED

## 2020-02-02 ENCOUNTER — Encounter (HOSPITAL_COMMUNITY): Payer: Self-pay | Admitting: Emergency Medicine

## 2020-02-02 ENCOUNTER — Other Ambulatory Visit: Payer: Self-pay

## 2020-02-02 ENCOUNTER — Emergency Department (HOSPITAL_COMMUNITY): Payer: 59

## 2020-02-02 ENCOUNTER — Emergency Department (HOSPITAL_COMMUNITY)
Admission: EM | Admit: 2020-02-02 | Discharge: 2020-02-02 | Disposition: A | Discharge status: Home / Self Care | Payer: 59 | Attending: Emergency Medicine | Admitting: Emergency Medicine

## 2020-02-02 DIAGNOSIS — R059 Cough, unspecified: Secondary | ICD-10-CM | POA: Diagnosis present

## 2020-02-02 DIAGNOSIS — R591 Generalized enlarged lymph nodes: Secondary | ICD-10-CM | POA: Diagnosis not present

## 2020-02-02 DIAGNOSIS — Z20822 Contact with and (suspected) exposure to covid-19: Secondary | ICD-10-CM | POA: Insufficient documentation

## 2020-02-02 DIAGNOSIS — J029 Acute pharyngitis, unspecified: Secondary | ICD-10-CM | POA: Insufficient documentation

## 2020-02-02 LAB — RESPIRATORY PANEL BY RT PCR (FLU A&B, COVID)
Influenza A by PCR: NEGATIVE
Influenza B by PCR: NEGATIVE
SARS Coronavirus 2 by RT PCR: NEGATIVE

## 2020-02-02 LAB — COMPREHENSIVE METABOLIC PANEL
ALT: 13 U/L (ref 0–44)
AST: 16 U/L (ref 15–41)
Albumin: 3.7 g/dL (ref 3.5–5.0)
Alkaline Phosphatase: 56 U/L (ref 38–126)
Anion gap: 10 (ref 5–15)
BUN: 9 mg/dL (ref 6–20)
CO2: 23 mmol/L (ref 22–32)
Calcium: 9.1 mg/dL (ref 8.9–10.3)
Chloride: 104 mmol/L (ref 98–111)
Creatinine, Ser: 0.84 mg/dL (ref 0.44–1.00)
GFR calc Af Amer: >60 mL/min (ref >60)
GFR calc non Af Amer: >60 mL/min (ref >60)
Glucose, Bld: 103 mg/dL — ABNORMAL HIGH (ref 70–99)
Potassium: 3.4 mmol/L — ABNORMAL LOW (ref 3.5–5.1)
Sodium: 137 mmol/L (ref 135–145)
Total Bilirubin: 0.8 mg/dL (ref 0.3–1.2)
Total Protein: 7.2 g/dL (ref 6.5–8.1)

## 2020-02-02 LAB — CBC WITH DIFFERENTIAL/PLATELET
Abs Immature Granulocytes: 0.04 10*3/uL (ref 0.00–0.07)
Basophils Absolute: 0 10*3/uL (ref 0.0–0.1)
Basophils Relative: 0 %
Eosinophils Absolute: 0.1 10*3/uL (ref 0.0–0.5)
Eosinophils Relative: 1 %
HCT: 39 % (ref 36.0–46.0)
Hemoglobin: 12.9 g/dL (ref 12.0–15.0)
Immature Granulocytes: 1 %
Lymphocytes Relative: 22 %
Lymphs Abs: 1.8 10*3/uL (ref 0.7–4.0)
MCH: 30.1 pg (ref 26.0–34.0)
MCHC: 33.1 g/dL (ref 30.0–36.0)
MCV: 91.1 fL (ref 80.0–100.0)
Monocytes Absolute: 0.5 10*3/uL (ref 0.1–1.0)
Monocytes Relative: 6 %
Neutro Abs: 5.7 10*3/uL (ref 1.7–7.7)
Neutrophils Relative %: 70 %
Platelets: 271 10*3/uL (ref 150–400)
RBC: 4.28 MIL/uL (ref 3.87–5.11)
RDW: 11.8 % (ref 11.5–15.5)
WBC: 8.2 10*3/uL (ref 4.0–10.5)
nRBC: 0 % (ref 0.0–0.2)

## 2020-02-02 LAB — I-STAT BETA HCG BLOOD, ED (MC, WL, AP ONLY): I-stat hCG, quantitative: <5 m[IU]/mL (ref <5)

## 2020-02-02 LAB — CULTURE, GROUP A STREP (THRC)

## 2020-02-02 LAB — MONONUCLEOSIS SCREEN: Mono Screen: NEGATIVE

## 2020-02-02 LAB — GROUP A STREP BY PCR: Group A Strep by PCR: NOT DETECTED

## 2020-02-02 MED ORDER — KETOROLAC TROMETHAMINE 30 MG/ML IJ SOLN
15.0000 mg | Freq: Once | INTRAMUSCULAR | Status: AC
  Administered 2020-02-02: 15 mg via INTRAVENOUS
  Filled 2020-02-02: qty 1

## 2020-02-02 MED ORDER — SODIUM CHLORIDE 0.9 % IV BOLUS
1000.0000 mL | Freq: Once | INTRAVENOUS | Status: AC
  Administered 2020-02-02: 1000 mL via INTRAVENOUS

## 2020-02-02 NOTE — ED Triage Notes (Signed)
Pt. Stated, I have a sore throat and congestion.

## 2020-02-02 NOTE — ED Provider Notes (Signed)
[facility] EMERGENCY DEPARTMENT Provider Note   CSN: [id number] Arrival date & time: 02/02/20  0940     History Chief Complaint  Patient presents with  . Sore Throat  . Nasal Congestion    Gloria Estes is a 24 y.o. female.  HPI    Patient presents with concern of cough, sore throat, malaise. Onset was 1 week ago.  Since that time patient has had persistent symptoms in spite of taking all the OTC medication she and her husband could obtain. She notes that she does have one positive sick contact the son with similar illness. Patient has no fever, no dyspnea, but with worsening overall concern, she went to urgent care 3 days ago.  She reportedly had negative Covid, negative strep test.  In spite of all medication attempts, patient continues to feel poorly, and presents for evaluation. History reviewed. No pertinent past medical history.  Patient Active Problem List   Diagnosis Date Noted  . Menorrhagia with irregular cycle 10/10/2019  . Encounter for surveillance of injectable contraceptive 10/10/2019  . 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 No pertinent surgical history.   OB History    Gravida  2   Para  1   Term  1   Preterm      AB      Living  1     SAB      TAB      Ectopic      Multiple      Live Births  1           Family History  Problem Relation Age of Onset  . Healthy Mother   . Healthy Father     Social History   Tobacco Use  . Smoking status: Never Smoker  . Smokeless tobacco: Never Used  Vaping Use  . Vaping Use: Never used  Substance Use Topics  . Alcohol use: Yes    Comment: social  . Drug use: Never    Home Medications Prior to Admission medications   Medication Sig Start Date End Date Taking? Authorizing Provider  cetirizine (ZYRTEC ALLERGY) 10 MG tablet Take 1 tablet (10 mg total) by mouth daily.  01/30/20   Hall-Potvin, [place], PA-C  etonogestrel-ethinyl estradiol (NUVARING) 0.12-0.015 MG/24HR vaginal ring Place 1 each vaginally every 28 (twenty-eight) days. Insert vaginally and leave in place for 3 consecutive weeks, then remove for 1 week. 10/11/19   Romualdo Bolk, MD  fluticasone (FLONASE) 50 MCG/ACT nasal spray Place 1 spray into both nostrils daily. 01/30/20   Hall-Potvin, [place], PA-C  albuterol (PROVENTIL HFA;VENTOLIN HFA) 108 (90 Base) MCG/ACT inhaler Inhale 1 puff into the lungs every 6 (six) hours as needed for wheezing or shortness of breath.  03/20/19  [provider]  ferrous fumarate (HEMOCYTE - 106 MG FE) 325 (106 FE) MG TABS tablet Take 1 tablet by mouth daily.   03/20/19  [provider]  medroxyPROGESTERone (DEPO-PROVERA) 150 MG/ML injection Inject 150 mg into the muscle every 3 (three) months. 08/02/19 01/30/20  [provider]    Allergies    Patient has no known allergies.  Review of Systems   Review of Systems  Constitutional:       Per HPI, otherwise negative  HENT:       Per HPI, otherwise negative  Respiratory:       Per HPI, otherwise negative  Cardiovascular:  Per HPI, otherwise negative  Gastrointestinal: Negative for vomiting.  Endocrine:       Negative aside from HPI  Genitourinary:       Neg aside from HPI   Musculoskeletal:       Per HPI, otherwise negative  Skin: Negative.   Neurological: Negative for syncope.    Physical Exam Updated Vital Signs BP 101/77 (BP Location: Left Arm)   Pulse 98   Temp 99.1 F (37.3 C) (Oral)   Resp 16   LMP 01/16/2020   SpO2 98%   Physical Exam Vitals and nursing note reviewed.  Constitutional:      General: She is not in acute distress.    Appearance: She is well-developed.  HENT:     Head: Normocephalic and atraumatic.     Right Ear: Tympanic membrane normal.     Left Ear: Tympanic membrane normal.     Mouth/Throat:     Pharynx: No oropharyngeal exudate.      Tonsils: No tonsillar exudate.  Eyes:     Conjunctiva/sclera: Conjunctivae normal.  Cardiovascular:     Rate and Rhythm: Regular rhythm. Tachycardia present.  Pulmonary:     Effort: Pulmonary effort is normal. No respiratory distress.     Breath sounds: Normal breath sounds. No stridor.  Abdominal:     General: There is no distension.  Lymphadenopathy:     Cervical: Cervical adenopathy present.     Left cervical: Posterior cervical adenopathy present.  Skin:    General: Skin is warm and dry.  Neurological:     Mental Status: She is alert and oriented to person, place, and time.     Cranial Nerves: No cranial nerve deficit.     ED Results / Procedures / Treatments   Labs (all labs ordered are listed, but only abnormal results are displayed) Labs Reviewed  GROUP A STREP BY PCR  RESPIRATORY PANEL BY RT PCR (FLU A&B, COVID)  COMPREHENSIVE METABOLIC PANEL  CBC WITH DIFFERENTIAL/PLATELET  MONONUCLEOSIS SCREEN  I-STAT BETA HCG BLOOD, ED ([facility], [facility], [facility] ONLY)   Radiology DG Chest Port 1 View  Result Date: 02/02/2020 CLINICAL DATA:  Cough, sore throat and congestion for 4 days. EXAM: PORTABLE CHEST 1 VIEW COMPARISON:  None. FINDINGS: Lungs clear. Heart size normal. No pneumothorax or pleural fluid. No bony abnormality. IMPRESSION: Normal chest. Electronically Signed   By: Drusilla Kanner M.D.   On: 02/02/2020 15:04    Procedures Procedures (including critical care time)  Medications Ordered in ED Medications  sodium chloride 0.9 % bolus 1,000 mL (has no administration in time range)  ketorolac (TORADOL) 30 MG/ML injection 15 mg (has no administration in time range)    ED Course  I have reviewed the triage vital signs and the nursing notes.  Pertinent labs & imaging results that were available during my care of the patient were reviewed by me and considered in my medical decision making (see chart for details).  Adult female presents with 1 week of ongoing illness including  slight temperature elevation, sore throat, cough, congestion Patient awake, alert, speaking in the hoarse voice, but has no gross posterior oropharyngeal lesions, no stridor. Patient found to have lymphadenopathy, and given her persistent symptoms, labs, including strep, Covid, mono sent.  Patient had resuscitation with fluids, Toradol.  Dr. Rhunette Croft is aware of the patient.  Final Clinical Impression(s) / ED Diagnoses Final diagnoses:  Sore throat  Lymphadenopathy     Gerhard Munch, MD 02/02/20 1534

## 2020-02-02 NOTE — ED Triage Notes (Signed)
Pt. Test ed neg for strept and COVID on Wednesday at the [facility] on [address].

## 2020-02-02 NOTE — Discharge Instructions (Addendum)
All the results in the ER are normal, labs and imaging. We are not sure what is causing your symptoms. The workup in the ER is not complete, and is limited to screening for life threatening and emergent conditions only, so please see a primary care doctor for further evaluation.  

## 2020-02-02 NOTE — ED Notes (Signed)
Patient verbalizes understanding of discharge instructions. Opportunity for questioning and answers were provided. Armband removed by staff, pt discharged from ED ambulatory.   

## 2020-03-24 ENCOUNTER — Ambulatory Visit
Admission: EM | Admit: 2020-03-24 | Discharge: 2020-03-24 | Disposition: A | Discharge status: Home / Self Care | Payer: 59 | Attending: Emergency Medicine | Admitting: Emergency Medicine

## 2020-03-24 DIAGNOSIS — N649 Disorder of breast, unspecified: Secondary | ICD-10-CM

## 2020-03-24 MED ORDER — AMOXICILLIN-POT CLAVULANATE 875-125 MG PO TABS: 1.0000 | ORAL_TABLET | Freq: Two times a day (BID) | ORAL | 0 refills | Status: AC

## 2020-03-24 NOTE — ED Provider Notes (Signed)
[name]    CSN: [id number] Arrival date & time: 03/24/20  6568      History   Chief Complaint Chief Complaint  Patient presents with  . Breast Pain    HPI Gloria Estes is a 24 y.o. female  Left nipple pain discharge.  Has piercings, though states these are old.  Denies trauma, injury.  Not breast-feeding or pregnant.  Denies fever.  Does have family history of breast cancer: Grandmothers who were "old" at time of diagnosis.  Denies breast mass.  History reviewed. No pertinent past medical history.  Patient Active Problem List   Diagnosis Date Noted  . Menorrhagia with irregular cycle 10/10/2019  . Encounter for surveillance of injectable contraceptive 10/10/2019  . 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 No pertinent surgical history.  OB History    Gravida  2   Para  1   Term  1   Preterm      AB      Living  1     SAB      TAB      Ectopic      Multiple      Live Births  1            Home Medications    Prior to Admission medications   Medication Sig Start Date End Date Taking? Authorizing Provider  amoxicillin-clavulanate (AUGMENTIN) 875-125 MG tablet Take 1 tablet by mouth every 12 (twelve) hours for 5 days. 03/24/20 03/29/20  Hall-Potvin, [place], PA-C  cetirizine (ZYRTEC ALLERGY) 10 MG tablet Take 1 tablet (10 mg total) by mouth daily. 01/30/20   Hall-Potvin, [place], PA-C  fluticasone (FLONASE) 50 MCG/ACT nasal spray Place 1 spray into both nostrils daily. 01/30/20   Hall-Potvin, [place], PA-C  albuterol (PROVENTIL HFA;VENTOLIN HFA) 108 (90 Base) MCG/ACT inhaler Inhale 1 puff into the lungs every 6 (six) hours as needed for wheezing or shortness of breath.  03/20/19  [provider]  ferrous fumarate (HEMOCYTE - 106 MG FE) 325 (106 FE) MG TABS tablet Take 1 tablet by mouth daily.   03/20/19  [provider]   medroxyPROGESTERone (DEPO-PROVERA) 150 MG/ML injection Inject 150 mg into the muscle every 3 (three) months. 08/02/19 01/30/20  [provider]    Family History Family History  Problem Relation Age of Onset  . Healthy Mother   . Healthy Father     Social History Social History   Tobacco Use  . Smoking status: Never Smoker  . Smokeless tobacco: Never Used  Vaping Use  . Vaping Use: Never used  Substance Use Topics  . Alcohol use: Yes    Comment: social  . Drug use: Never     Allergies   Patient has no known allergies.   Review of Systems Review of Systems  Constitutional: Negative for fatigue and fever.  HENT: Negative for ear pain, sinus pain, sore throat and voice change.   Eyes: Negative for pain, redness and visual disturbance.  Respiratory: Negative for cough and shortness of breath.   Cardiovascular: Negative for chest pain and palpitations.  Gastrointestinal: Negative for abdominal pain, diarrhea and vomiting.  Musculoskeletal: Negative for arthralgias and myalgias.  Skin: Positive for wound. Negative for rash.  Neurological: Negative for syncope and headaches.     Physical Exam Triage Vital Signs ED Triage Vitals  Enc Vitals Group     BP 03/24/20 0833 125/86  Pulse Rate 03/24/20 0833 83     Resp 03/24/20 0833 16     Temp 03/24/20 0833 98.2 F (36.8 C)     Temp Source 03/24/20 0833 Oral     SpO2 03/24/20 0833 99 %     Weight --      Height --      Head Circumference --      Peak Flow --      Pain Score 03/24/20 0835 7     Pain Loc --      Pain Edu? --      Excl. in GC? --    No data found.  Updated Vital Signs BP 125/86 (BP Location: Left Arm)   Pulse 83   Temp 98.2 F (36.8 C) (Oral)   Resp 16   LMP 03/17/2020   SpO2 99%   Visual Acuity Right Eye Distance:   Left Eye Distance:   Bilateral Distance:    Right Eye Near:   Left Eye Near:    Bilateral Near:     Physical Exam Constitutional:      General: She is not in  acute distress. HENT:     Head: Normocephalic and atraumatic.  Eyes:     General: No scleral icterus.    Pupils: Pupils are equal, round, and reactive to light.  Cardiovascular:     Rate and Rhythm: Normal rate.  Pulmonary:     Effort: Pulmonary effort is normal.  Skin:    Coloration: Skin is not jaundiced or pale.     Comments: 2 mm circumferential lesion medial to nipple with tenderness, scant purulent discharge.  Piercings unremarkable.  No breast masses or LAD palpated.  Neurological:     Mental Status: She is alert and oriented to person, place, and time.      UC Treatments / Results  Labs (all labs ordered are listed, but only abnormal results are displayed) Labs Reviewed - No data to display  EKG   Radiology No results found.  Procedures Procedures (including critical care time)  Medications Ordered in UC Medications - No data to display  Initial Impression / Assessment and Plan / UC Course  I have reviewed the triage vital signs and the nursing notes.  Pertinent labs & imaging results that were available during my care of the patient were reviewed by me and considered in my medical decision making (see chart for details).     We will cover for infectious process with antibiotics as below.  Patient to follow-up with PCP/GYN in 1-2 weeks.  Return precautions discussed, pt verbalized understanding and is agreeable to plan. Final Clinical Impressions(s) / UC Diagnoses   Final diagnoses:  Lesion of left nipple     Discharge Instructions     Keep area(s) clean and dry. Apply hot compress / towel for 5-10 minutes 3-5 times daily. Take antibiotic as prescribed with food - important to complete course. Return for worsening pain, redness, swelling, discharge, fever.  Helpful prevention tips: Keep nails short to avoid secondary skin infections. Use new, clean razors when shaving. Avoid antiperspirants - look for deodorants without aluminum. Avoid wearing  underwire bras as this can irritate the area further.     ED Prescriptions    Medication Sig Dispense Auth. Provider   amoxicillin-clavulanate (AUGMENTIN) 875-125 MG tablet Take 1 tablet by mouth every 12 (twelve) hours for 5 days. 10 tablet Hall-Potvin, [place], PA-C     PDMP not reviewed this encounter.   Hall-Potvin, [place], [place] 03/24/20  0846  

## 2020-03-24 NOTE — ED Triage Notes (Signed)
Pt c/o pain with a bump to lt nipple since yesterday. States has a nipple ring but no injury.

## 2020-03-24 NOTE — Discharge Instructions (Signed)
Keep area(s) clean and dry. °Apply hot compress / towel for 5-10 minutes 3-5 times daily. °Take antibiotic as prescribed with food - important to complete course. °Return for worsening pain, redness, swelling, discharge, fever. ° °Helpful prevention tips: °Keep nails short to avoid secondary skin infections. °Use new, clean razors when shaving. °Avoid antiperspirants - look for deodorants without aluminum. °Avoid wearing underwire bras as this can irritate the area further.  °

## 2020-03-25 ENCOUNTER — Other Ambulatory Visit: Payer: Self-pay

## 2020-03-26 ENCOUNTER — Ambulatory Visit (INDEPENDENT_AMBULATORY_CARE_PROVIDER_SITE_OTHER): Payer: 59 | Admitting: Nurse Practitioner

## 2020-03-26 ENCOUNTER — Encounter: Payer: Self-pay | Admitting: Nurse Practitioner

## 2020-03-26 VITALS — BP 118/75 | HR 87 | Temp 97.3°F | Ht 69.0 in | Wt 145.0 lb

## 2020-03-26 DIAGNOSIS — H01139 Eczematous dermatitis of unspecified eye, unspecified eyelid: Secondary | ICD-10-CM | POA: Insufficient documentation

## 2020-03-26 DIAGNOSIS — S21002D Unspecified open wound of left breast, subsequent encounter: Secondary | ICD-10-CM

## 2020-03-26 DIAGNOSIS — H16299 Other keratoconjunctivitis, unspecified eye: Secondary | ICD-10-CM | POA: Diagnosis not present

## 2020-03-26 DIAGNOSIS — L2082 Flexural eczema: Secondary | ICD-10-CM | POA: Diagnosis not present

## 2020-03-26 MED ORDER — KETOTIFEN FUMARATE 0.025 % OP SOLN: 1.0000 [drp] | Freq: Two times a day (BID) | OPHTHALMIC | 0 refills | Status: DC

## 2020-03-26 MED ORDER — TRIAMCINOLONE ACETONIDE 0.1 % EX OINT: 1.0000 "application " | TOPICAL_OINTMENT | Freq: Two times a day (BID) | CUTANEOUS | 0 refills | Status: DC

## 2020-03-26 NOTE — Progress Notes (Signed)
Subjective:  Patient ID: Gloria Estes, female    DOB: [birth year]  Age: 24 y.o. MRN: [id number]  CC: Follow-up (pt c/o breast pain, started 3 days ago, was seen at urgent, pt states it was apimple on her nipple causing discomfort, pain has almost ceased wiht antibiotics, pt also states she has dry skin on her eyes right arm, pt statest she has tried several lotions that are used for dry skin, there is flaking and burning around patients eyes )  Rash This is a chronic problem. The current episode started more than 1 year ago. The problem has been waxing and waning since onset. The affected locations include the right elbow, left eye and right eye. The rash is characterized by dryness, itchiness and scaling. She was exposed to nothing. Pertinent negatives include no eye pain, facial edema, fatigue, fever or joint pain. Past treatments include topical steroids. The treatment provided mild relief. Her past medical history is significant for eczema.   Wound on Left nipple: Evaluated by ED provider 2days ago, Augmentin prescribed x 10days, reports resolved drainage and improved pain and redness.  Reviewed past Medical, Social and Family history today.  Outpatient Medications Prior to Visit  Medication Sig Dispense Refill  . amoxicillin-clavulanate (AUGMENTIN) 875-125 MG tablet Take 1 tablet by mouth every 12 (twelve) hours for 5 days. 10 tablet 0  . cetirizine (ZYRTEC ALLERGY) 10 MG tablet Take 1 tablet (10 mg total) by mouth daily. 30 tablet 0  . fluticasone (FLONASE) 50 MCG/ACT nasal spray Place 1 spray into both nostrils daily. 16 g 0   No facility-administered medications prior to visit.    ROS See HPI  Objective:  BP 118/75 (BP Location: Right Arm, Patient Position: Sitting, Cuff Size: Small)   Pulse 87   Temp (!) 97.3 F (36.3 C) (Temporal)   Ht 5\' 9"  (1.753 m)   Wt 145 lb (65.8 kg)   LMP 03/17/2020   SpO2 99%   BMI 21.41 kg/m   Physical Exam Vitals reviewed.  Eyes:      General: Vision grossly intact. Gaze aligned appropriately. Allergic shiner present.        Right eye: No foreign body, discharge or hordeolum.        Left eye: No foreign body, discharge or hordeolum.  Chest:     Breasts: Breasts are symmetrical.        Right: Normal.        Left: Normal.    Skin:    Findings: Rash present. Rash is macular.       Neurological:     Mental Status: She is alert.    Assessment & Plan:  This visit occurred during the SARS-CoV-2 public health emergency.  Safety protocols were in place, including screening questions prior to the visit, additional usage of staff PPE, and extensive cleaning of exam room while observing appropriate contact time as indicated for disinfecting solutions.   Gloria Estes was seen today for follow-up.  Diagnoses and all orders for this visit:  Wound of left breast, subsequent encounter  Atopic keratoconjunctivitis -     ketotifen (ZADITOR) 0.025 % ophthalmic solution; Place 1 drop into both eyes 2 (two) times daily. x7days  Flexural eczema -     triamcinolone ointment (KENALOG) 0.1 %; Apply 1 application topically 2 (two) times daily.  Complete oral abx as prescribed Use only hypoallergenic personal products. Use cold compress to soothe itching Use cerave SA or cetaphil cream to mosturize skin Use cool humidifier during fall  and winter season. Call office for eucrisa rx if no improvement in 2weeks.  Problem List Items Addressed This Visit      Musculoskeletal and Integument   Atopic dermatitis of eyelid   Relevant Medications   ketotifen (ZADITOR) 0.025 % ophthalmic solution    Other Visit Diagnoses    Wound of left breast, subsequent encounter    -  Primary   Flexural eczema       Relevant Medications   triamcinolone ointment (KENALOG) 0.1 %      Follow-up: No follow-ups on file.  Alysia Penna, NP

## 2020-03-26 NOTE — Patient Instructions (Signed)
Complete oral abx as prescribed Use only hypoallergenic personal products.  Use cold compress to soothe itching Use cerave SA or cetaphil cream to mosturize skin Use cool humidifier during fall and winter season. Call office for eucrisa rx if no improvement in 2weeks.  Atopic Dermatitis Atopic dermatitis is a skin disorder that causes inflammation of the skin. This is the most common type of eczema. Eczema is a group of skin conditions that cause the skin to be itchy, red, and swollen. This condition is generally worse during the cooler winter months and often improves during the warm summer months. Symptoms can vary from person to person. Atopic dermatitis usually starts showing signs in infancy and can last through adulthood. This condition cannot be passed from one person to another (non-contagious), but it is more common in families. Atopic dermatitis may not always be present. When it is present, it is called a flare-up. What are the causes? The exact cause of this condition is not known. Flare-ups of the condition may be triggered by:  Contact with something that you are sensitive or allergic to.  Stress.  Certain foods.  Extremely hot or cold weather.  Harsh chemicals and soaps.  Dry air.  Chlorine. What increases the risk? This condition is more likely to develop in people who have a personal history or family history of eczema, allergies, asthma, or hay fever. What are the signs or symptoms? Symptoms of this condition include:  Dry, scaly skin.  Red, itchy rash.  Itchiness, which can be severe. This may occur before the skin rash. This can make sleeping difficult.  Skin thickening and cracking that can occur over time. How is this diagnosed? This condition is diagnosed based on your symptoms, a medical history, and a physical exam. How is this treated? There is no cure for this condition, but symptoms can usually be controlled. Treatment focuses on:  Controlling  the itchiness and scratching. You may be given medicines, such as antihistamines or steroid creams.  Limiting exposure to things that you are sensitive or allergic to (allergens).  Recognizing situations that cause stress and developing a plan to manage stress. If your atopic dermatitis does not get better with medicines, or if it is all over your body (widespread), a treatment using a specific type of light (phototherapy) may be used. Follow these instructions at home: Skin care   Keep your skin well-moisturized. Doing this seals in moisture and helps to prevent dryness. ? Use unscented lotions that have petroleum in them. ? Avoid lotions that contain alcohol or water. They can dry the skin.  Keep baths or showers short (less than 5 minutes) in warm water. Do not use hot water. ? Use mild, unscented cleansers for bathing. Avoid soap and bubble bath. ? Apply a moisturizer to your skin right after a bath or shower.  Do not apply anything to your skin without checking with your health care provider. General instructions  Dress in clothes made of cotton or cotton blends. Dress lightly because heat increases itchiness.  When washing your clothes, rinse your clothes twice so all of the soap is removed.  Avoid any triggers that can cause a flare-up.  Try to manage your stress.  Keep your fingernails cut short.  Avoid scratching. Scratching makes the rash and itchiness worse. It may also result in a skin infection (impetigo) due to a break in the skin caused by scratching.  Take or apply over-the-counter and prescription medicines only as told by your health care  provider.  Keep all follow-up visits as told by your health care provider. This is important.  Do not be around people who have cold sores or fever blisters. If you get the infection, it may cause your atopic dermatitis to worsen. Contact a health care provider if:  Your itchiness interferes with sleep.  Your rash gets  worse or it is not better within one week of starting treatment.  You have a fever.  You have a rash flare-up after having contact with someone who has cold sores or fever blisters. Get help right away if:  You develop pus or soft yellow scabs in the rash area. Summary  This condition causes a red rash and itchy, dry, scaly skin.  Treatment focuses on controlling the itchiness and scratching, limiting exposure to things that you are sensitive or allergic to (allergens), recognizing situations that cause stress, and developing a plan to manage stress.  Keep your skin well-moisturized.  Keep baths or showers shorter than 5 minutes and use warm water. Do not use hot water. This information is not intended to replace advice given to you by your health care provider. Make sure you discuss any questions you have with your health care provider. Document Revised: 08/08/2018 Document Reviewed: 05/21/2016 Elsevier Patient Education  2020 [employer].

## 2020-05-29 ENCOUNTER — Inpatient Hospital Stay (HOSPITAL_COMMUNITY)
Admission: AD | Admit: 2020-05-29 | Discharge: 2020-05-29 | Disposition: A | Discharge status: Home / Self Care | Payer: 59 | Attending: Obstetrics and Gynecology | Admitting: Obstetrics and Gynecology

## 2020-05-29 ENCOUNTER — Ambulatory Visit
Admission: EM | Admit: 2020-05-29 | Discharge: 2020-05-29 | Disposition: A | Discharge status: Home / Self Care | Payer: 59 | Attending: Urgent Care | Admitting: Urgent Care

## 2020-05-29 ENCOUNTER — Other Ambulatory Visit: Payer: Self-pay

## 2020-05-29 ENCOUNTER — Encounter (HOSPITAL_COMMUNITY): Payer: Self-pay | Admitting: Obstetrics and Gynecology

## 2020-05-29 ENCOUNTER — Inpatient Hospital Stay (HOSPITAL_COMMUNITY): Payer: 59

## 2020-05-29 ENCOUNTER — Encounter: Payer: Self-pay | Admitting: Urgent Care

## 2020-05-29 DIAGNOSIS — R0602 Shortness of breath: Secondary | ICD-10-CM | POA: Diagnosis present

## 2020-05-29 DIAGNOSIS — Z3A01 Less than 8 weeks gestation of pregnancy: Secondary | ICD-10-CM

## 2020-05-29 DIAGNOSIS — R0789 Other chest pain: Secondary | ICD-10-CM | POA: Diagnosis not present

## 2020-05-29 DIAGNOSIS — R000 Tachycardia, unspecified: Secondary | ICD-10-CM | POA: Diagnosis not present

## 2020-05-29 DIAGNOSIS — Z79899 Other long term (current) drug therapy: Secondary | ICD-10-CM | POA: Insufficient documentation

## 2020-05-29 DIAGNOSIS — O99891 Other specified diseases and conditions complicating pregnancy: Secondary | ICD-10-CM

## 2020-05-29 DIAGNOSIS — O210 Mild hyperemesis gravidarum: Secondary | ICD-10-CM | POA: Diagnosis not present

## 2020-05-29 DIAGNOSIS — O26891 Other specified pregnancy related conditions, first trimester: Secondary | ICD-10-CM | POA: Diagnosis not present

## 2020-05-29 DIAGNOSIS — U071 COVID-19: Secondary | ICD-10-CM | POA: Insufficient documentation

## 2020-05-29 DIAGNOSIS — R9431 Abnormal electrocardiogram [ECG] [EKG]: Secondary | ICD-10-CM | POA: Insufficient documentation

## 2020-05-29 DIAGNOSIS — O98519 Other viral diseases complicating pregnancy, unspecified trimester: Secondary | ICD-10-CM | POA: Insufficient documentation

## 2020-05-29 DIAGNOSIS — O98511 Other viral diseases complicating pregnancy, first trimester: Secondary | ICD-10-CM

## 2020-05-29 DIAGNOSIS — O4691 Antepartum hemorrhage, unspecified, first trimester: Secondary | ICD-10-CM

## 2020-05-29 DIAGNOSIS — O209 Hemorrhage in early pregnancy, unspecified: Secondary | ICD-10-CM | POA: Diagnosis not present

## 2020-05-29 DIAGNOSIS — O219 Vomiting of pregnancy, unspecified: Secondary | ICD-10-CM | POA: Insufficient documentation

## 2020-05-29 LAB — URINALYSIS, ROUTINE W REFLEX MICROSCOPIC
Bilirubin Urine: NEGATIVE
Glucose, UA: NEGATIVE mg/dL
Hgb urine dipstick: NEGATIVE
Ketones, ur: NEGATIVE mg/dL
Nitrite: NEGATIVE
Protein, ur: NEGATIVE mg/dL
Specific Gravity, Urine: 1.027 (ref 1.005–1.030)
pH: 6 (ref 5.0–8.0)

## 2020-05-29 LAB — CBC
HCT: 39.2 % (ref 36.0–46.0)
Hemoglobin: 13.2 g/dL (ref 12.0–15.0)
MCH: 30.6 pg (ref 26.0–34.0)
MCHC: 33.7 g/dL (ref 30.0–36.0)
MCV: 91 fL (ref 80.0–100.0)
Platelets: 283 10*3/uL (ref 150–400)
RBC: 4.31 MIL/uL (ref 3.87–5.11)
RDW: 11.8 % (ref 11.5–15.5)
WBC: 7.3 10*3/uL (ref 4.0–10.5)
nRBC: 0 % (ref 0.0–0.2)

## 2020-05-29 LAB — COMPREHENSIVE METABOLIC PANEL
ALT: 12 U/L (ref 0–44)
AST: 14 U/L — ABNORMAL LOW (ref 15–41)
Albumin: 3.8 g/dL (ref 3.5–5.0)
Alkaline Phosphatase: 47 U/L (ref 38–126)
Anion gap: 9 (ref 5–15)
BUN: 7 mg/dL (ref 6–20)
CO2: 24 mmol/L (ref 22–32)
Calcium: 9.3 mg/dL (ref 8.9–10.3)
Chloride: 103 mmol/L (ref 98–111)
Creatinine, Ser: 0.73 mg/dL (ref 0.44–1.00)
GFR, Estimated: >60 mL/min (ref >60)
Glucose, Bld: 89 mg/dL (ref 70–99)
Potassium: 3.5 mmol/L (ref 3.5–5.1)
Sodium: 136 mmol/L (ref 135–145)
Total Bilirubin: 0.6 mg/dL (ref 0.3–1.2)
Total Protein: 7.2 g/dL (ref 6.5–8.1)

## 2020-05-29 LAB — POCT PREGNANCY, URINE: Preg Test, Ur: POSITIVE — AB

## 2020-05-29 LAB — HCG, QUANTITATIVE, PREGNANCY: hCG, Beta Chain, Quant, S: 97689 m[IU]/mL — ABNORMAL HIGH (ref <5)

## 2020-05-29 MED ORDER — LACTATED RINGERS IV BOLUS: 1000.0000 mL | Freq: Once | INTRAVENOUS | Status: DC

## 2020-05-29 MED ORDER — SCOPOLAMINE 1 MG/3DAYS TD PT72: 1.0000 | MEDICATED_PATCH | TRANSDERMAL | 12 refills | Status: DC

## 2020-05-29 MED ORDER — PROMETHAZINE HCL 12.5 MG PO TABS: 12.5000 mg | ORAL_TABLET | Freq: Four times a day (QID) | ORAL | 0 refills | Status: DC | PRN

## 2020-05-29 MED ORDER — ONDANSETRON 4 MG PO TBDP: 4.0000 mg | ORAL_TABLET | Freq: Three times a day (TID) | ORAL | 0 refills | Status: DC | PRN

## 2020-05-29 NOTE — MAU Provider Note (Signed)
History     [id number]  Arrival date and time: 05/29/20 1558    Chief Complaint  Patient presents with  . Shortness of Breath     HPI Gloria Estes is a 25 y.o. at 4334w0d by LMP with PMHx notable for prior child with tetrology of fallot, recent COVID+ test on 05/16/2020, who presents for multiple issues from [facility].   Review of records from urgent care: seen at urgent care earlier today for n/v as well as chest pain and SOB since testing positive for COVID-19. She had an ECG with non-specific changes as well as tachycardia and was referred to MAU for further evaluation.   On my history patient reports she had a regular period prior to conceiving, pregnancy is unplanned but welcome. She has had persistent nausea and vomiting and feels that she has lost weight. Has not currently taken anything for her symptoms yet. She has an appt scheduled with her OB at [facility] tomorrow to get an US and discuss her nausea.   She has also had some vaginal bleeding on and off for the past week along with intermittent cramps, she would not call it pain.  Also tested positive for COVID on 05/16/2020, she was tested on a return to school protocol but was asymptomatic. A few days after that had mild cold symptoms. Over the past four to five days has had a "tightness" in her chest and feels like she gets winded easily but at rest has no symptoms.  Reports worried about ECG being abnormal at urgent care. Says son has tetrology of fallot so she is concerned. Mother also has hx of pulmonary stenosis and follows with specialists.     --/--/O POS Performed at [facility], [address] [place], [place] [address]  (01/27 1434)  OB History    Gravida  3   Para  1   Term  1   Preterm      AB  1   Living  1     SAB      IAB  1   Ectopic      Multiple      Live Births  1        Obstetric Comments  Due date 01/15/21        History reviewed. No pertinent past medical  history.  History reviewed. No pertinent surgical history.  Family History  Problem Relation Age of Onset  . Healthy Mother   . Healthy Father     Social History   Socioeconomic History  . Marital status: Single    Spouse name: Not on file  . Number of children: Not on file  . Years of education: Not on file  . Highest education level: Not on file  Occupational History  . Not on file  Tobacco Use  . Smoking status: Never Smoker  . Smokeless tobacco: Never Used  Vaping Use  . Vaping Use: Never used  Substance and Sexual Activity  . Alcohol use: Not Currently    Comment: social  . Drug use: Never  . Sexual activity: Yes    Birth control/protection: None  Other Topics Concern  . Not on file  Social History Narrative  . Not on file   Social Determinants of Health   Financial Resource Strain: Not on file  Food Insecurity: Not on file  Transportation Needs: Not on file  Physical Activity: Not on file  Stress: Not on file  Social Connections: Not on  file  Intimate Partner Violence: Not on file    No Known Allergies  No current facility-administered medications on file prior to encounter.   Current Outpatient Medications on File Prior to Encounter  Medication Sig Dispense Refill  . cetirizine (ZYRTEC ALLERGY) 10 MG tablet Take 1 tablet (10 mg total) by mouth daily. 30 tablet 0  . fluticasone (FLONASE) 50 MCG/ACT nasal spray Place 1 spray into both nostrils daily. 16 g 0  . ketotifen (ZADITOR) 0.025 % ophthalmic solution Place 1 drop into both eyes 2 (two) times daily. x7days 5 mL 0  . triamcinolone ointment (KENALOG) 0.1 % Apply 1 application topically 2 (two) times daily. 30 g 0  . [DISCONTINUED] albuterol (PROVENTIL HFA;VENTOLIN HFA) 108 (90 Base) MCG/ACT inhaler Inhale 1 puff into the lungs every 6 (six) hours as needed for wheezing or shortness of breath.    . [DISCONTINUED] ferrous fumarate (HEMOCYTE - 106 MG FE) 325 (106 FE) MG TABS tablet Take 1 tablet by  mouth daily.     . [DISCONTINUED] medroxyPROGESTERone (DEPO-PROVERA) 150 MG/ML injection Inject 150 mg into the muscle every 3 (three) months.       ROS Pertinent positives and negative per HPI, all others reviewed and negative  Physical Exam   BP 116/70   Pulse 100   Temp 98.9 F (37.2 C)   Resp 18   LMP 04/10/2020   SpO2 99%   Patient Vitals for the past 24 hrs:  BP Temp Pulse Resp SpO2  05/29/20 1620 116/70 98.9 F (37.2 C) 100 18 99 %    Physical Exam Vitals reviewed.  Constitutional:      General: She is not in acute distress.    Appearance: She is well-developed and well-nourished. She is not diaphoretic.  Eyes:     General: No scleral icterus. Cardiovascular:     Rate and Rhythm: Normal rate and regular rhythm.     Heart sounds: No murmur heard. No gallop.   Pulmonary:     Effort: Pulmonary effort is normal. No respiratory distress.     Breath sounds: Normal breath sounds. No wheezing, rhonchi or rales.  Abdominal:     General: There is no distension.     Palpations: Abdomen is soft.     Tenderness: There is no abdominal tenderness. There is no guarding or rebound.  Musculoskeletal:        General: No edema.  Skin:    General: Skin is warm and dry.  Neurological:     Mental Status: She is alert.     Coordination: Coordination normal.  Psychiatric:        Mood and Affect: Mood and affect normal.      Cervical Exam    Bedside Ultrasound Pt informed that the ultrasound is considered a limited OB ultrasound and is not intended to be a complete ultrasound exam.  Patient also informed that the ultrasound is not being completed with the intent of assessing for fetal or placental anomalies or any pelvic abnormalities.  Explained that the purpose of today's ultrasound is to assess for  location and viability.  Patient acknowledges the purpose of the exam and the limitations of the study.    My interpretation: IUP seen with FHR 162 bpm, CRL c/w dates  CXR:  (my interpretation) clear lung fields, normal cardiac silhouette  ECG: (my interpretation) NSR, enlarged P wave in II suggestive of R atrial enlargement, some aberrant R wave progression as well in precordial leads. No ST changes or other  ischemic changes.    Labs Results for orders placed or performed during the hospital encounter of 05/29/20 (from the past 24 hour(s))  ABO/Rh     Status: None   Collection Time: 05/29/20  2:34 PM  Result Value Ref Range   ABO/RH(D)      O POS Performed at [facility], [address] [place], [place] [address]   Urinalysis, Routine w reflex microscopic Urine, Clean Catch     Status: Abnormal   Collection Time: 05/29/20  4:26 PM  Result Value Ref Range   Color, Urine YELLOW YELLOW   APPearance HAZY (A) CLEAR   Specific Gravity, Urine 1.027 1.005 - 1.030   pH 6.0 5.0 - 8.0   Glucose, UA NEGATIVE NEGATIVE mg/dL   Hgb urine dipstick NEGATIVE NEGATIVE   Bilirubin Urine NEGATIVE NEGATIVE   Ketones, ur NEGATIVE NEGATIVE mg/dL   Protein, ur NEGATIVE NEGATIVE mg/dL   Nitrite NEGATIVE NEGATIVE   Leukocytes,Ua TRACE (A) NEGATIVE   RBC / HPF 0-5 0 - 5 RBC/hpf   WBC, UA 0-5 0 - 5 WBC/hpf   Bacteria, UA FEW (A) NONE SEEN   Squamous Epithelial / LPF 0-5 0 - 5   Mucus PRESENT   Pregnancy, urine POC     Status: Abnormal   Collection Time: 05/29/20  4:29 PM  Result Value Ref Range   Preg Test, Ur POSITIVE (A) NEGATIVE  CBC     Status: None   Collection Time: 05/29/20  4:52 PM  Result Value Ref Range   WBC 7.3 4.0 - 10.5 K/uL   RBC 4.31 3.87 - 5.11 MIL/uL   Hemoglobin 13.2 12.0 - 15.0 g/dL   HCT 62.1 30.8 - 65.7 %   MCV 91.0 80.0 - 100.0 fL   MCH 30.6 26.0 - 34.0 pg   MCHC 33.7 30.0 - 36.0 g/dL   RDW 84.6 96.2 - 95.2 %   Platelets 283 150 - 400 K/uL   nRBC 0.0 0.0 - 0.2 %  Comprehensive metabolic panel     Status: Abnormal   Collection Time: 05/29/20  4:52 PM  Result Value Ref Range   Sodium 136 135 - 145 mmol/L   Potassium 3.5 3.5 - 5.1  mmol/L   Chloride 103 98 - 111 mmol/L   CO2 24 22 - 32 mmol/L   Glucose, Bld 89 70 - 99 mg/dL   BUN 7 6 - 20 mg/dL   Creatinine, Ser 8.41 0.44 - 1.00 mg/dL   Calcium 9.3 8.9 - 32.4 mg/dL   Total Protein 7.2 6.5 - 8.1 g/dL   Albumin 3.8 3.5 - 5.0 g/dL   AST 14 (L) 15 - 41 U/L   ALT 12 0 - 44 U/L   Alkaline Phosphatase 47 38 - 126 U/L   Total Bilirubin 0.6 0.3 - 1.2 mg/dL   GFR, Estimated >40 >10 mL/min   Anion gap 9 5 - 15  hCG, quantitative, pregnancy     Status: Abnormal   Collection Time: 05/29/20  4:54 PM  Result Value Ref Range   hCG, Beta Chain, Quant, S 27,253 (H) <5 mIU/mL    Imaging DG Chest Portable 1 View  Result Date: 05/29/2020 CLINICAL DATA:  Chest pain and COVID-19 positivity EXAM: PORTABLE CHEST 1 VIEW COMPARISON:  02/02/2020 FINDINGS: Cardiac shadow is within normal limits. Lungs are well aerated bilaterally. Some minimal patchy airspace opacity is noted within the right lung consistent with the given clinical history. No other focal abnormality is noted. IMPRESSION: Minimal  patchy airspace opacity consistent with the given clinical history of COVID-19 positivity. Electronically Signed   By: Alcide Clever M.D.   On: 05/29/2020 16:53    MAU Course  Procedures  Lab Orders     Urinalysis, Routine w reflex microscopic Urine, Clean Catch     CBC     Comprehensive metabolic panel     hCG, quantitative, pregnancy     Pregnancy, urine POC Meds ordered this encounter  Medications  . DISCONTD: lactated ringers bolus 1,000 mL  . promethazine (PHENERGAN) 12.5 MG tablet    Sig: Take 1 tablet (12.5 mg total) by mouth every 6 (six) hours as needed for nausea or vomiting.    Dispense:  30 tablet    Refill:  0  . scopolamine (TRANSDERM-SCOP, 1.5 MG,) 1 MG/3DAYS    Sig: Place 1 patch (1.5 mg total) onto the skin every 3 (three) days.    Dispense:  10 patch    Refill:  12  . ondansetron (ZOFRAN ODT) 4 MG disintegrating tablet    Sig: Take 1 tablet (4 mg total) by mouth  every 8 (eight) hours as needed for nausea or vomiting.    Dispense:  20 tablet    Refill:  0    Imaging Orders     DG Chest Portable 1 View  MDM High  Assessment and Plan  #Vaginal bleeding, first trimester of pregnancy #Viable IUP Patient ruled out for ectopic with bedside US, viable IUP c/w dates and normal fetal cardiac activity present. Encouraged patient to keep appt with OB tomorrow to establish care.   #N/v of pregnancy Reviewed options, sent scopolamine, zofran, phenergan. Counseled on unproven association but overall low absolute risk of craniofacial defects with zofran in first trimester.   #COVID-19 Infection #Exertional dyspnea My read of CXR unremarkable, radiology with patchy opacities c/w COVID infection. On my exam patient no longer tachycardic, satting normally on room air, able to converse normally and overall well appearing. Counseled patient that this is typical for COVID symptoms, return precautions discussed.   #Abnormal ECG Patient ECG suggestive of R atrial enlargement with enlarged P wave in II. Given hx of her mother having pulmonary stenosis and this abnormal ECG I recommended she follow up with a cardiologist as an outpatient for a formal evaluation to include an echocardiogram. She is in agreement with this plan.   #FWB FHR 162 bpm  Venora Maples, MD/MPH 05/29/20 8:22 PM  Allergies as of 05/29/2020   No Known Allergies     Medication List    TAKE these medications   cetirizine 10 MG tablet Commonly known as: ZyrTEC Allergy Take 1 tablet (10 mg total) by mouth daily.   fluticasone 50 MCG/ACT nasal spray Commonly known as: FLONASE Place 1 spray into both nostrils daily.   ketotifen 0.025 % ophthalmic solution Commonly known as: ZADITOR Place 1 drop into both eyes 2 (two) times daily. x7days   ondansetron 4 MG disintegrating tablet Commonly known as: Zofran ODT Take 1 tablet (4 mg total) by mouth every 8 (eight) hours as needed for  nausea or vomiting.   promethazine 12.5 MG tablet Commonly known as: PHENERGAN Take 1 tablet (12.5 mg total) by mouth every 6 (six) hours as needed for nausea or vomiting.   scopolamine 1 MG/3DAYS Commonly known as: Transderm-Scop (1.5 MG) Place 1 patch (1.5 mg total) onto the skin every 3 (three) days.   triamcinolone ointment 0.1 % Commonly known as: KENALOG Apply 1 application topically 2 (two)  times daily.

## 2020-05-29 NOTE — Discharge Instructions (Signed)
Your symptoms and return assessment management for vaginal bleeding, shortness of breath, and abnormal EKG.  We did an ultrasound which showed that you have a normal pregnancy in your uterus with a heartbeat measuring around 8 weeks.  You should follow-up as planned with your regular OBGYN provider tomorrow.  For your Covid infection and shortness of breath we did a chest x-ray which showed some mild findings consistent with your Covid infection, however your heart rate, oxygen levels, and respiratory rate were all normal which is in reassuring.  Finally I reviewed your abnormal EKG, given your mother's history of pulmonary stenosis you should see a cardiologist to get an ultrasound of your heart and have an evaluation for this as well.   COVID-19 Quarantine vs. Isolation QUARANTINE keeps someone who was in close contact with someone who has COVID-19 away from others. Quarantine if you have been in close contact with someone who has COVID-19, unless you have been fully vaccinated. If you are fully vaccinated  You do NOT need to quarantine unless they have symptoms  Get tested 3-5 days after your exposure, even if you don't have symptoms  Wear a mask indoors in public for 14 days following exposure or until your test result is negative If you are not fully vaccinated  Stay home for 14 days after your last contact with a person who has COVID-19  Watch for fever (100.38F), cough, shortness of breath, or other symptoms of COVID-19  If possible, stay away from people you live with, especially people who are at higher risk for getting very sick from COVID-19  Contact your local public health department for options in your area to possibly shorten your quarantine ISOLATION keeps someone who is sick or tested positive for COVID-19 without symptoms away from others, even in their own home. People who are in isolation should stay home and stay in a specific "sick room" or area and use a separate  bathroom (if available). If you are sick and think or know you have COVID-19 Stay home until after  At least 10 days since symptoms first appeared and  At least 24 hours with no fever without the use of fever-reducing medications and  Symptoms have improved If you tested positive for COVID-19 but do not have symptoms  Stay home until after 10 days have passed since your positive viral test  If you develop symptoms after testing positive, follow the steps above for those who are sick [email or web address] 01/28/2020 This information is not intended to replace advice given to you by your health care provider. Make sure you discuss any questions you have with your health care provider. Document Revised: 03/03/2020 Document Reviewed: 03/03/2020 Elsevier Patient Education  2021 [employer].

## 2020-05-29 NOTE — MAU Note (Signed)
Pt report she tested positive for Covid on 1/14. Stated she has been feeling her heart beat fast and hard that started last week. Feeling SOB as well. Went to urgent care today and was told her EKG was abnormal and to come to [facility] hospital. Report she is also having vaginal bleeding/spotting  with some mild cramping.

## 2020-05-29 NOTE — Discharge Instructions (Signed)
Please head to the Maternal Admission Unit [facility]) now for a more in depth evaluation of your chest pain, shortness of breath. You have an abnormal ekg, tachycardia and you will need a higher level of evaluation and intervention than we can provide in the urgent care setting.

## 2020-05-29 NOTE — ED Triage Notes (Signed)
Pt presents today [redacted] wks pregnant, +Covid on 05/16/20, c/o of SOB, palpitations, n/v/d, morning sickness x 4 wks, told by OB to come to UC.

## 2020-05-29 NOTE — ED Provider Notes (Signed)
[facility]   MRN: [id number] DOB: 01-Aug-1995  Subjective:   Gloria Estes is a 25 y.o. female [redacted] weeks pregnant presenting for 4 week history of persistent n/v/d, not having chest pain, shortness of breath intermittently since testing positive for COVID-19 on 05/16/2020.  She contacted her obstetrician and was advised to come to our clinic for an evaluation.  No current facility-administered medications for this encounter.  Current Outpatient Medications:  .  cetirizine (ZYRTEC ALLERGY) 10 MG tablet, Take 1 tablet (10 mg total) by mouth daily., Disp: 30 tablet, Rfl: 0 .  fluticasone (FLONASE) 50 MCG/ACT nasal spray, Place 1 spray into both nostrils daily., Disp: 16 g, Rfl: 0 .  ketotifen (ZADITOR) 0.025 % ophthalmic solution, Place 1 drop into both eyes 2 (two) times daily. x7days, Disp: 5 mL, Rfl: 0 .  triamcinolone ointment (KENALOG) 0.1 %, Apply 1 application topically 2 (two) times daily., Disp: 30 g, Rfl: 0   No Known Allergies  History reviewed. No pertinent past medical history.   History reviewed. No pertinent surgical history.  Family History  Problem Relation Age of Onset  . Healthy Mother   . Healthy Father     Social History   Tobacco Use  . Smoking status: Never Smoker  . Smokeless tobacco: Never Used  Vaping Use  . Vaping Use: Never used  Substance Use Topics  . Alcohol use: Yes    Comment: social  . Drug use: Never    ROS   Objective:   Vitals: BP 111/87 (BP Location: Right Arm)   Pulse (!) 107   Temp 98.5 F (36.9 C) (Oral)   Resp 18   Ht 5\' 9"  (1.753 m)   Wt 145 lb (65.8 kg)   LMP 03/17/2020   SpO2 98%   BMI 21.41 kg/m   Physical Exam Constitutional:      General: She is not in acute distress.    Appearance: Normal appearance. She is well-developed. She is not ill-appearing, toxic-appearing or diaphoretic.  HENT:     Head: Normocephalic and atraumatic.     Nose: Nose normal.     Mouth/Throat:     Mouth: Mucous  membranes are moist.  Eyes:     Extraocular Movements: Extraocular movements intact.     Pupils: Pupils are equal, round, and reactive to light.  Cardiovascular:     Rate and Rhythm: Regular rhythm. Tachycardia present.     Pulses: Normal pulses.     Heart sounds: Normal heart sounds. No murmur heard. No friction rub. No gallop.   Pulmonary:     Effort: Pulmonary effort is normal. No respiratory distress.     Breath sounds: Normal breath sounds. No stridor. No wheezing, rhonchi or rales.  Skin:    General: Skin is warm and dry.     Findings: No rash.  Neurological:     Mental Status: She is alert and oriented to person, place, and time.  Psychiatric:        Mood and Affect: Mood normal.        Behavior: Behavior normal.        Thought Content: Thought content normal.     ED ECG REPORT   Date: 05/29/2020  EKG Time: 3:01 PM  Rate: 101bpm  Rhythm: sinus tachycardia,  there are no previous tracings available for comparison, sinus tachycardia  Axis: normal  Intervals:none  Narrative Interpretation: sinus tachycardia at 101bpm with non-specific T-wave inversion in III, aVF, V3, V4 and inverted p-wave in aVL,  V1-V2. No previous ecg for comparison.    Assessment and Plan :   PDMP not reviewed this encounter.  1. Atypical chest pain   2. Tachycardia   3. [redacted] weeks gestation of pregnancy   4. Shortness of breath     Recommended patient report to the maternal admission unit at the [facility] hospital for further work-up and intervention of her atypical chest pain, shortness of breath and tachycardia given her nonspecific abnormal EKG. patient is hemodynamically stable, will present to the women's hospital by personal vehicle.   Wallis Bamberg, [place] 05/29/20 [phone]

## 2020-05-30 LAB — ABO/RH: ABO/RH(D): O POS

## 2020-10-15 ENCOUNTER — Encounter: Payer: 59 | Admitting: Nurse Practitioner

## 2020-11-12 ENCOUNTER — Other Ambulatory Visit: Payer: Self-pay | Admitting: Obstetrics and Gynecology

## 2020-11-12 DIAGNOSIS — Z363 Encounter for antenatal screening for malformations: Secondary | ICD-10-CM

## 2020-11-12 IMAGING — DX DG CHEST 1V PORT
1 series · 1 of 1 positions shown · non-contrast
Comparison: None.

CLINICAL DATA: Cough, sore throat and congestion for 4 days.

EXAM:
PORTABLE CHEST 1 VIEW

[chest]
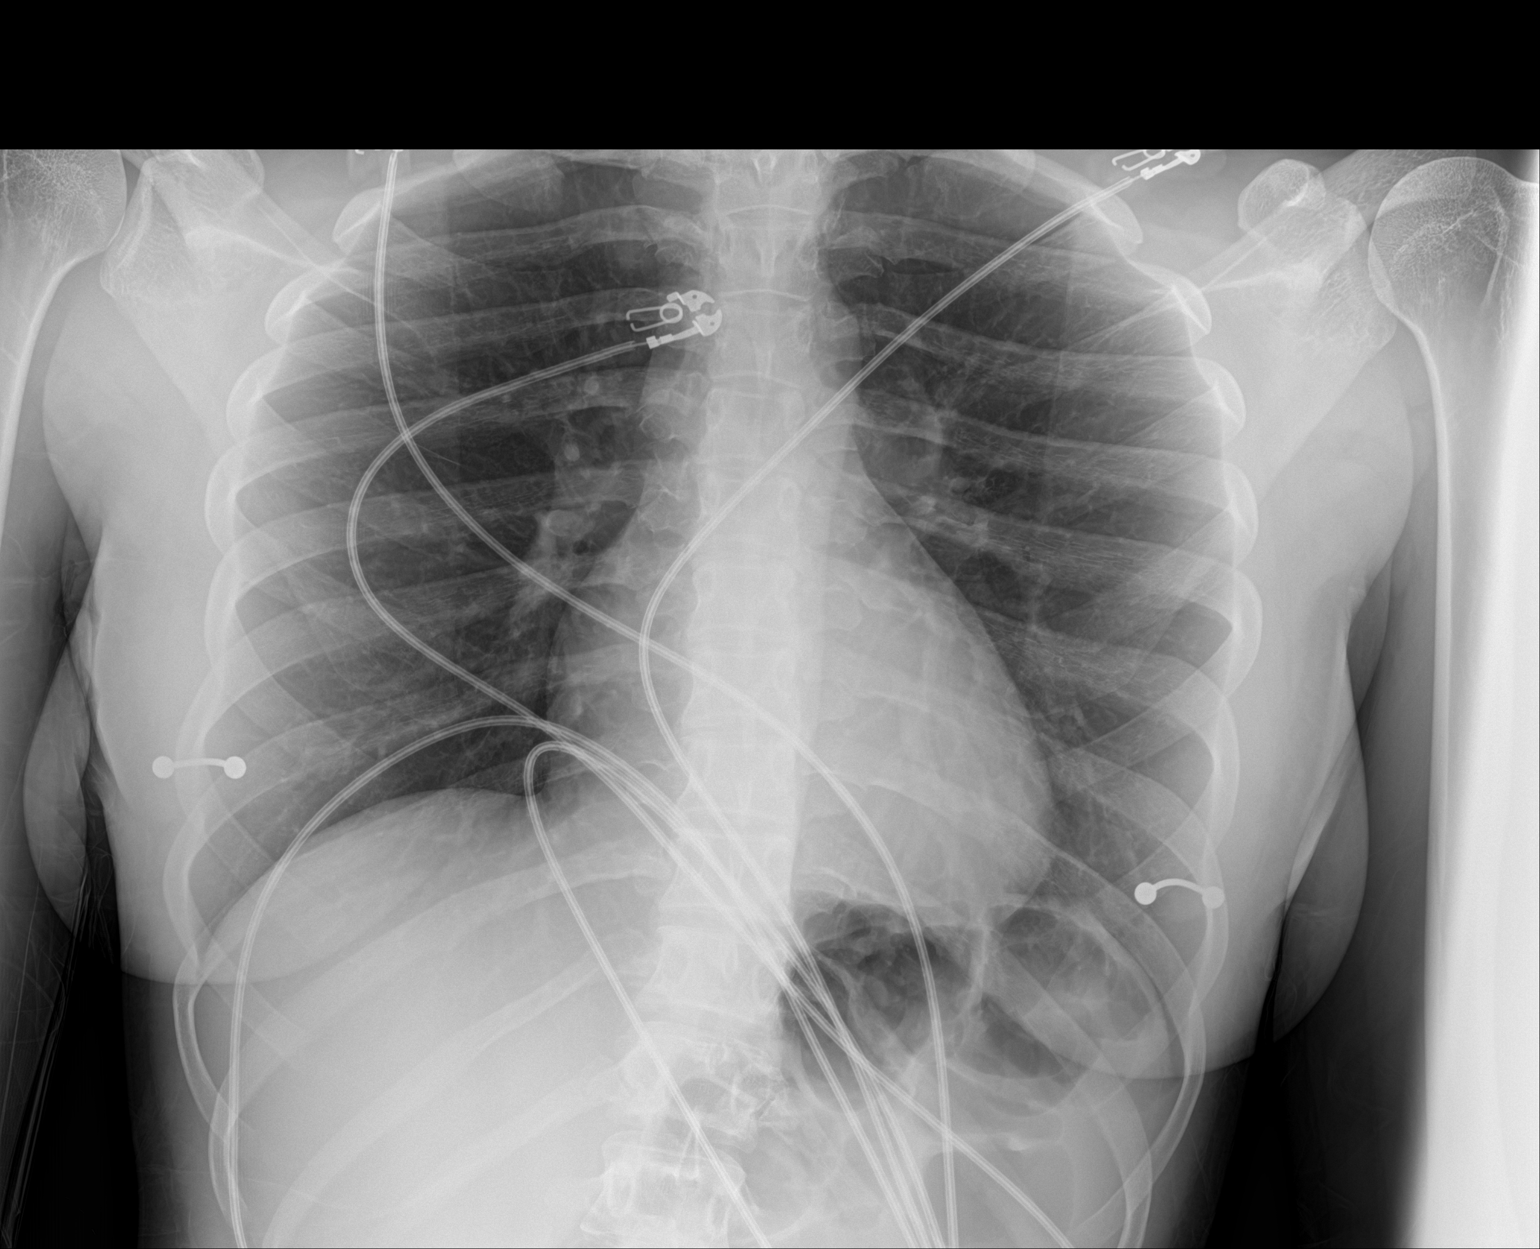

[1 of 1 positions shown; findings below may reference images not displayed]

FINDINGS: Lungs clear. Heart size normal. No pneumothorax or pleural fluid. No
bony abnormality.
IMPRESSION: Normal chest.

## 2020-11-21 ENCOUNTER — Encounter: Payer: Self-pay | Admitting: Cardiology

## 2020-12-10 ENCOUNTER — Inpatient Hospital Stay (HOSPITAL_COMMUNITY)
Admission: AD | Admit: 2020-12-10 | Discharge: 2020-12-10 | Disposition: A | Discharge status: Home / Self Care | Payer: 59 | Attending: Obstetrics and Gynecology | Admitting: Obstetrics and Gynecology

## 2020-12-10 ENCOUNTER — Encounter (HOSPITAL_COMMUNITY): Payer: Self-pay | Admitting: Obstetrics and Gynecology

## 2020-12-10 ENCOUNTER — Other Ambulatory Visit: Payer: Self-pay

## 2020-12-10 DIAGNOSIS — O4690 Antepartum hemorrhage, unspecified, unspecified trimester: Secondary | ICD-10-CM

## 2020-12-10 DIAGNOSIS — R109 Unspecified abdominal pain: Secondary | ICD-10-CM

## 2020-12-10 DIAGNOSIS — Z3A13 13 weeks gestation of pregnancy: Secondary | ICD-10-CM | POA: Diagnosis not present

## 2020-12-10 DIAGNOSIS — O468X1 Other antepartum hemorrhage, first trimester: Secondary | ICD-10-CM

## 2020-12-10 DIAGNOSIS — O26891 Other specified pregnancy related conditions, first trimester: Secondary | ICD-10-CM | POA: Diagnosis not present

## 2020-12-10 DIAGNOSIS — O208 Other hemorrhage in early pregnancy: Secondary | ICD-10-CM | POA: Insufficient documentation

## 2020-12-10 DIAGNOSIS — O418X10 Other specified disorders of amniotic fluid and membranes, first trimester, not applicable or unspecified: Secondary | ICD-10-CM

## 2020-12-10 LAB — URINALYSIS, ROUTINE W REFLEX MICROSCOPIC
Bilirubin Urine: NEGATIVE
Glucose, UA: NEGATIVE mg/dL
Ketones, ur: NEGATIVE mg/dL
Leukocytes,Ua: NEGATIVE
Nitrite: NEGATIVE
Protein, ur: NEGATIVE mg/dL
Specific Gravity, Urine: 1.02 (ref 1.005–1.030)
pH: 7 (ref 5.0–8.0)

## 2020-12-10 LAB — CBC
HCT: 33.7 % — ABNORMAL LOW (ref 36.0–46.0)
Hemoglobin: 11.2 g/dL — ABNORMAL LOW (ref 12.0–15.0)
MCH: 30.9 pg (ref 26.0–34.0)
MCHC: 33.2 g/dL (ref 30.0–36.0)
MCV: 93.1 fL (ref 80.0–100.0)
Platelets: 237 10*3/uL (ref 150–400)
RBC: 3.62 MIL/uL — ABNORMAL LOW (ref 3.87–5.11)
RDW: 13.3 % (ref 11.5–15.5)
WBC: 8.5 10*3/uL (ref 4.0–10.5)
nRBC: 0 % (ref 0.0–0.2)

## 2020-12-10 LAB — URINALYSIS, MICROSCOPIC (REFLEX)

## 2020-12-10 NOTE — MAU Note (Signed)
Gloria Estes is a 25 y.o. at [redacted]w[redacted]d here in MAU reporting: has a known subchorionic hematoma. Last night started having really bad cramping and then this AM started having vaginal bleeding. Is currently wearing a pad, has changed it once this AM. Saw some clots when she initially used the bathroom this AM.  Onset of complaint: yesterday  Pain score: 7/10  Vitals:   12/10/20 1026  BP: 113/68  Pulse: 76  Resp: 17  Temp: 99 F (37.2 C)  SpO2: 98%     FHT:152  Lab orders placed from triage: UA

## 2020-12-10 NOTE — MAU Provider Note (Signed)
History     CSN: [id number]  Arrival date and time: 12/10/20 1000   Event Date/Time   First Provider Initiated Contact with Patient 12/10/20 1047      Chief Complaint  Patient presents with   Abdominal Pain   Vaginal Bleeding   HPI Gloria Estes is a 25 y.o. [id number] at [redacted]w[redacted]d who presents to MAU with chief complaint of vaginal bleeding in the setting of previously diagnosed subchorionic hematoma. She experienced new onset lower abdominal cramping last night followed by new onset bleeding this morning. When she voided first thing this morning she saw multiple small clots. Pain score 7/10, non-radiating. She has not taken medication or tried other treatments for this complaint. She denies dizziness, weakness, syncope.  OB History     Gravida  4   Para  1   Term  1   Preterm      AB  2   Living  1      SAB      IAB  2   Ectopic      Multiple      Live Births  1        Obstetric Comments  Due date 01/15/21         History reviewed. No pertinent past medical history.  Past Surgical History:  Procedure Laterality Date   DILATION AND CURETTAGE OF UTERUS      Family History  Problem Relation Age of Onset   Healthy Mother    Healthy Father     Social History   Tobacco Use   Smoking status: Never   Smokeless tobacco: Never  Vaping Use   Vaping Use: Never used  Substance Use Topics   Alcohol use: Not Currently    Comment: social   Drug use: Never    Allergies: No Known Allergies  No medications prior to admission.    Review of Systems  Gastrointestinal:  Positive for abdominal pain.  Genitourinary:  Positive for vaginal bleeding.  All other systems reviewed and are negative. Physical Exam   Blood pressure 112/66, pulse 87, temperature 99 F (37.2 C), temperature source Oral, resp. rate 17, last menstrual period 04/10/2020, SpO2 100 %, unknown if currently breastfeeding.  Physical Exam Vitals and nursing note reviewed. Exam conducted  with a chaperone present.  Constitutional:      Appearance: She is well-developed. She is not ill-appearing.  Cardiovascular:     Rate and Rhythm: Normal rate.     Heart sounds: Normal heart sounds.  Pulmonary:     Effort: Pulmonary effort is normal.     Breath sounds: Normal breath sounds.  Abdominal:     General: Abdomen is flat. Bowel sounds are normal.     Palpations: Abdomen is soft.     Tenderness: There is no abdominal tenderness.  Genitourinary:    Vagina: Normal.     Cervix: Normal.     Comments: Pelvic exam: External genitalia normal, vaginal walls pink and well rugated, cervix visually closed, no lesions noted. Small amount dark brown blood in vaginal vault. Removed with fox swab x 1.   Skin:    Capillary Refill: Capillary refill takes less than 2 seconds.  Neurological:     Mental Status: She is alert and oriented to person, place, and time.    MAU Course  Procedures  --IUP previously confirmed in office per scanned prenatal records --Live IUP confirmed today in MAU via bedside ultrasound. Very active fetal movement  Orders Placed This Encounter  Procedures   Urinalysis, Routine w reflex microscopic Urine, Clean Catch   CBC   Urinalysis, Microscopic (reflex)   Discharge patient   Results for orders placed or performed during the hospital encounter of 12/10/20 (from the past 24 hour(s))  Urinalysis, Routine w reflex microscopic Urine, Clean Catch     Status: Abnormal   Collection Time: 12/10/20 10:38 AM  Result Value Ref Range   Color, Urine YELLOW YELLOW   APPearance CLEAR CLEAR   Specific Gravity, Urine 1.020 1.005 - 1.030   pH 7.0 5.0 - 8.0   Glucose, UA NEGATIVE NEGATIVE mg/dL   Hgb urine dipstick MODERATE (A) NEGATIVE   Bilirubin Urine NEGATIVE NEGATIVE   Ketones, ur NEGATIVE NEGATIVE mg/dL   Protein, ur NEGATIVE NEGATIVE mg/dL   Nitrite NEGATIVE NEGATIVE   Leukocytes,Ua NEGATIVE NEGATIVE  Urinalysis, Microscopic (reflex)     Status: Abnormal    Collection Time: 12/10/20 10:38 AM  Result Value Ref Range   RBC / HPF 0-5 0 - 5 RBC/hpf   WBC, UA 0-5 0 - 5 WBC/hpf   Bacteria, UA RARE (A) NONE SEEN   Squamous Epithelial / LPF 0-5 0 - 5  CBC     Status: Abnormal   Collection Time: 12/10/20 10:51 AM  Result Value Ref Range   WBC 8.5 4.0 - 10.5 K/uL   RBC 3.62 (L) 3.87 - 5.11 MIL/uL   Hemoglobin 11.2 (L) 12.0 - 15.0 g/dL   HCT 76.1 (L) 60.7 - 37.1 %   MCV 93.1 80.0 - 100.0 fL   MCH 30.9 26.0 - 34.0 pg   MCHC 33.2 30.0 - 36.0 g/dL   RDW 06.2 69.4 - 85.4 %   Platelets 237 150 - 400 K/uL   nRBC 0.0 0.0 - 0.2 %   Assessment and Plan  --25 y.o. [id number] at [redacted]w[redacted]d  --Previously diagnosed Subchorionic Hematoma --FHT 150 via BSUS --Blood type O POS --Hgb 11.2 --Continue pelvic rest as previously advised --Discharge home in stable condition  Calvert Cantor, CNM 12/10/2020, 3:33 PM

## 2020-12-11 ENCOUNTER — Ambulatory Visit (INDEPENDENT_AMBULATORY_CARE_PROVIDER_SITE_OTHER): Payer: 59 | Admitting: Interventional Cardiology

## 2020-12-11 ENCOUNTER — Encounter: Payer: Self-pay | Admitting: Interventional Cardiology

## 2020-12-11 VITALS — BP 90/56 | HR 84 | Ht 69.0 in | Wt 141.6 lb

## 2020-12-11 DIAGNOSIS — R0602 Shortness of breath: Secondary | ICD-10-CM

## 2020-12-11 DIAGNOSIS — R9431 Abnormal electrocardiogram [ECG] [EKG]: Secondary | ICD-10-CM | POA: Diagnosis not present

## 2020-12-11 DIAGNOSIS — R002 Palpitations: Secondary | ICD-10-CM

## 2020-12-11 NOTE — Progress Notes (Signed)
Cardiology Office Note   Date:  12/11/2020   ID:  Gloria Estes, DOB Jul 18, 1995, MRN [id number]  PCP:  Anne Ng, NP    No chief complaint on file.  Abnormal ECG  Wt Readings from Last 3 Encounters:  12/11/20 141 lb 9.6 oz (64.2 kg)  05/29/20 145 lb (65.8 kg)  03/26/20 145 lb (65.8 kg)       History of Present Illness: Gloria Estes is a 25 y.o. female who is being seen today for the evaluation of abnormla ECG at the request of Gerald Leitz, MD.   ECG from Jan 2022 showed NSR, RAE, NSST.  She has no h/o heart problemsShe was just getting over COVID at the time.   Son has tetralogy of Fallot.  He had surgery at age 53 and is now doing well.  Denies : Chest pain. Dizziness. Leg edema. Nitroglycerin use. Orthopnea. Paroxysmal nocturnal dyspnea. Syncope  She feels fatigue, SHOB, palpitations.    No past medical history on file.  Past Surgical History:  Procedure Laterality Date   DILATION AND CURETTAGE OF UTERUS       Current Outpatient Medications  Medication Sig Dispense Refill   FLOVENT HFA 220 MCG/ACT inhaler Inhale 2 puffs into the lungs 2 (two) times daily.     fluticasone (FLONASE) 50 MCG/ACT nasal spray 1-2 spray in each nostril     levocetirizine (XYZAL) 5 MG tablet 1 tablet in the evening     No current facility-administered medications for this visit.    Allergies:   Patient has no known allergies.    Social History:  The patient  reports that she has never smoked. She has never used smokeless tobacco. She reports that she does not currently use alcohol. She reports that she does not use drugs.   Family History:  The patient's family history includes Healthy in her father and mother.    ROS:  Please see the history of present illness.   Otherwise, review of systems are positive for rare palpitations and worsening dyspnea on exertion.   All other systems are reviewed and negative.    PHYSICAL EXAM: VS:  BP (!) 90/56   Pulse 84   Ht 5'  9" (1.753 m)   Wt 141 lb 9.6 oz (64.2 kg)   LMP 04/10/2020   SpO2 99%   BMI 20.91 kg/m  , BMI Body mass index is 20.91 kg/m. GEN: Well nourished, well developed, in no acute distress HEENT: normal Neck: no JVD, carotid bruits, or masses Cardiac: RRR; no murmurs, rubs, or gallops,no edema  Respiratory:  clear to auscultation bilaterally, normal work of breathing GI: soft, nontender, nondistended, + BS MS: no deformity or atrophy Skin: warm and dry, no rash Neuro:  Strength and sensation are intact Psych: euthymic mood, full affect   EKG:   The ekg ordered today demonstrates normal sinus rhythm, right atrial enlargement, nonspecific ST-T wave changes   Recent Labs: 05/29/2020: ALT 12; BUN 7; Creatinine, Ser 0.73; Potassium 3.5; Sodium 136 12/10/2020: Hemoglobin 11.2; Platelets 237   Lipid Panel No results found for: CHOL, TRIG, HDL, CHOLHDL, VLDL, LDLCALC, LDLDIRECT   Other studies Reviewed: Additional studies/ records that were reviewed today with results demonstrating: labs reviewed.   ASSESSMENT AND PLAN:  Abnormal ECG: Right atrial enlargement.  Mild T wave changes. DOE: Check echo.  No obvious structural heart disease on exam.  Family h/o Mom with pulm stenosis, and child with Tetralogy of Fallot.  FAmily h/o structural  heart disease.  Mild palpitations.  No associated syncope.  Short-lived.  May be related to her being pregnant.  She will continue to monitor.  If they become more severe, she will let us know and we will plan for a monitor.   Current medicines are reviewed at length with the patient today.  The patient concerns regarding her medicines were addressed.  The following changes have been made:  No change  Labs/ tests ordered today include:  No orders of the defined types were placed in this encounter.   Recommend 150 minutes/week of aerobic exercise Low fat, low carb, high fiber diet recommended  Disposition:   FU based on echo   Signed, Lance Muss, MD  12/11/2020 4:05 PM    [facility] [address] [place], [place], [place]  [address] Phone: [phone]; Fax: [phone]

## 2020-12-11 NOTE — Patient Instructions (Signed)
Medication Instructions:  Your physician recommends that you continue on your current medications as directed. Please refer to the Current Medication list given to you today.  *If you need a refill on your cardiac medications before your next appointment, please call your pharmacy*   Lab Work: none If you have labs (blood work) drawn today and your tests are completely normal, you will receive your results only by: MyChart Message (if you have MyChart) OR A paper copy in the mail If you have any lab test that is abnormal or we need to change your treatment, we will call you to review the results.   Testing/Procedures: Your physician has requested that you have an echocardiogram. Echocardiography is a painless test that uses sound waves to create images of your heart. It provides your doctor with information about the size and shape of your heart and how well your heart's chambers and valves are working. This procedure takes approximately one hour. There are no restrictions for this procedure.    Follow-Up: At [facility], you and your health needs are our priority.  As part of our continuing mission to provide you with exceptional heart care, we have created designated Provider Care Teams.  These Care Teams include your primary Cardiologist (physician) and Advanced Practice Providers (APPs -  Physician Assistants and Nurse Practitioners) who all work together to provide you with the care you need, when you need it.  We recommend signing up for the patient portal called "MyChart".  Sign up information is provided on this After Visit Summary.  MyChart is used to connect with patients for Virtual Visits (Telemedicine).  Patients are able to view lab/test results, encounter notes, upcoming appointments, etc.  Non-urgent messages can be sent to your provider as well.   To learn more about what you can do with MyChart, go to [email or web address].    Your next appointment:   Based on  results  The format for your next appointment:   In Person  Provider:   You may see Lance Muss, MD or one of the following Advanced Practice Providers on your designated Care Team:   Ronie Spies, PA-C Jacolyn Reedy, PA-C   Other Instructions  Call or send message to office if palpitations worsen

## 2020-12-15 ENCOUNTER — Ambulatory Visit: Payer: 59 | Admitting: Cardiology

## 2021-01-01 ENCOUNTER — Ambulatory Visit (HOSPITAL_COMMUNITY): Payer: 59 | Attending: Cardiology

## 2021-01-01 ENCOUNTER — Other Ambulatory Visit: Payer: Self-pay

## 2021-01-01 DIAGNOSIS — R0602 Shortness of breath: Secondary | ICD-10-CM | POA: Diagnosis present

## 2021-01-01 LAB — ECHOCARDIOGRAM COMPLETE
Area-P 1/2: 4.15 cm2
S' Lateral: 2.4 cm

## 2021-01-19 ENCOUNTER — Other Ambulatory Visit: Payer: Self-pay

## 2021-01-19 ENCOUNTER — Ambulatory Visit: Payer: 59 | Admitting: *Deleted

## 2021-01-19 ENCOUNTER — Ambulatory Visit: Payer: 59 | Attending: Obstetrics and Gynecology

## 2021-01-19 ENCOUNTER — Encounter: Payer: Self-pay | Admitting: *Deleted

## 2021-01-19 ENCOUNTER — Other Ambulatory Visit: Payer: Self-pay | Admitting: *Deleted

## 2021-01-19 VITALS — BP 118/59 | HR 96

## 2021-01-19 DIAGNOSIS — O283 Abnormal ultrasonic finding on antenatal screening of mother: Secondary | ICD-10-CM | POA: Diagnosis present

## 2021-01-19 DIAGNOSIS — O4593 Premature separation of placenta, unspecified, third trimester: Secondary | ICD-10-CM | POA: Diagnosis not present

## 2021-01-19 DIAGNOSIS — O09293 Supervision of pregnancy with other poor reproductive or obstetric history, third trimester: Secondary | ICD-10-CM

## 2021-01-19 DIAGNOSIS — O09292 Supervision of pregnancy with other poor reproductive or obstetric history, second trimester: Secondary | ICD-10-CM

## 2021-01-19 DIAGNOSIS — Z3A19 19 weeks gestation of pregnancy: Secondary | ICD-10-CM

## 2021-01-19 DIAGNOSIS — Z363 Encounter for antenatal screening for malformations: Secondary | ICD-10-CM | POA: Insufficient documentation

## 2021-01-19 DIAGNOSIS — Z3689 Encounter for other specified antenatal screening: Secondary | ICD-10-CM | POA: Diagnosis not present

## 2021-01-26 ENCOUNTER — Telehealth: Payer: Self-pay

## 2021-01-26 NOTE — Telephone Encounter (Signed)
FETAL ECHO SCHEDULED @ [facility] FOR 02/11/2021@2P .

## 2021-02-11 DIAGNOSIS — O09292 Supervision of pregnancy with other poor reproductive or obstetric history, second trimester: Secondary | ICD-10-CM | POA: Insufficient documentation

## 2021-02-23 ENCOUNTER — Ambulatory Visit: Payer: 59 | Admitting: *Deleted

## 2021-02-23 ENCOUNTER — Other Ambulatory Visit: Payer: Self-pay

## 2021-02-23 ENCOUNTER — Encounter: Payer: Self-pay | Admitting: *Deleted

## 2021-02-23 ENCOUNTER — Ambulatory Visit: Payer: 59 | Attending: Obstetrics

## 2021-02-23 ENCOUNTER — Other Ambulatory Visit: Payer: Self-pay | Admitting: *Deleted

## 2021-02-23 VITALS — BP 125/60 | HR 88

## 2021-02-23 DIAGNOSIS — Z3689 Encounter for other specified antenatal screening: Secondary | ICD-10-CM | POA: Insufficient documentation

## 2021-02-23 DIAGNOSIS — O368990 Maternal care for other specified fetal problems, unspecified trimester, not applicable or unspecified: Secondary | ICD-10-CM

## 2021-02-23 DIAGNOSIS — Z3A24 24 weeks gestation of pregnancy: Secondary | ICD-10-CM | POA: Diagnosis not present

## 2021-02-23 DIAGNOSIS — Z362 Encounter for other antenatal screening follow-up: Secondary | ICD-10-CM | POA: Diagnosis not present

## 2021-02-23 DIAGNOSIS — O09292 Supervision of pregnancy with other poor reproductive or obstetric history, second trimester: Secondary | ICD-10-CM | POA: Diagnosis not present

## 2021-02-28 ENCOUNTER — Encounter (HOSPITAL_BASED_OUTPATIENT_CLINIC_OR_DEPARTMENT_OTHER): Payer: Self-pay

## 2021-02-28 ENCOUNTER — Emergency Department (HOSPITAL_BASED_OUTPATIENT_CLINIC_OR_DEPARTMENT_OTHER)
Admission: EM | Admit: 2021-02-28 | Discharge: 2021-02-28 | Disposition: A | Discharge status: Home / Self Care | Payer: 59 | Attending: Emergency Medicine | Admitting: Emergency Medicine

## 2021-02-28 ENCOUNTER — Other Ambulatory Visit: Payer: Self-pay

## 2021-02-28 DIAGNOSIS — O26892 Other specified pregnancy related conditions, second trimester: Secondary | ICD-10-CM | POA: Diagnosis not present

## 2021-02-28 DIAGNOSIS — Z3A25 25 weeks gestation of pregnancy: Secondary | ICD-10-CM | POA: Diagnosis not present

## 2021-02-28 DIAGNOSIS — Z23 Encounter for immunization: Secondary | ICD-10-CM | POA: Diagnosis not present

## 2021-02-28 DIAGNOSIS — S61211A Laceration without foreign body of left index finger without damage to nail, initial encounter: Secondary | ICD-10-CM | POA: Diagnosis not present

## 2021-02-28 DIAGNOSIS — W260XXA Contact with knife, initial encounter: Secondary | ICD-10-CM | POA: Diagnosis not present

## 2021-02-28 DIAGNOSIS — O9A212 Injury, poisoning and certain other consequences of external causes complicating pregnancy, second trimester: Secondary | ICD-10-CM | POA: Diagnosis not present

## 2021-02-28 MED ORDER — TETANUS-DIPHTH-ACELL PERTUSSIS 5-2.5-18.5 LF-MCG/0.5 IM SUSY
0.5000 mL | PREFILLED_SYRINGE | Freq: Once | INTRAMUSCULAR | Status: AC
  Administered 2021-02-28: 0.5 mL via INTRAMUSCULAR
  Filled 2021-02-28: qty 0.5

## 2021-02-28 NOTE — Discharge Instructions (Addendum)
Please see attached nonsutured laceration care. Do not get your wound wet for the next 24 hours. The glue will dissolve on its own in the next 5-6 days.   Follow up with your PCP regarding ED visit today  Return to the ED for any signs of infection to the wound including redness/swelling around the wound, drainage of pus, fevers > 100.4

## 2021-02-28 NOTE — ED Provider Notes (Signed)
[facility] EMERGENCY DEPARTMENT Provider Note   CSN: [id number] Arrival date & time: 02/28/21  1801     History Chief Complaint  Patient presents with   Finger Injury    Gloria Estes is a 25 y.o. female who presents to the ED today with complaint of left index finger laceration that occurred about 30 minutes ago.  Patient states she accidentally cut her finger while slicing mango.  She states she is unsure regarding her tetanus status.  She is approximately [redacted] weeks pregnant currently.  She complains of some pain to the area.  Bleeding is controlled currently.  She has no other complaints at this time.  The history is provided by the patient and medical records.      History reviewed. No pertinent past medical history.  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    Past Surgical History:  Procedure Laterality Date   DILATION AND CURETTAGE OF UTERUS       OB History     Gravida  4   Para  1   Term  1   Preterm      AB  2   Living  1      SAB      IAB  2   Ectopic      Multiple      Live Births  1        Obstetric Comments  Due date 01/15/21         Family History  Problem Relation Age of Onset   Healthy Mother    Healthy Father     Social History   Tobacco Use   Smoking status: Never   Smokeless tobacco: Never  Vaping Use   Vaping Use: Never used  Substance Use Topics   Alcohol use: Not Currently    Comment: social   Drug use: Never    Home Medications Prior to Admission medications   Medication Sig Start Date End Date Taking? Authorizing Provider  FLOVENT HFA 220 MCG/ACT inhaler Inhale 2 puffs into the lungs 2 (two) times daily. 11/10/20   [provider]  fluticasone Aleda Grana) 50 MCG/ACT nasal  spray 1-2 spray in each nostril Patient not taking: Reported on 01/19/2021 05/06/20   [provider]  levocetirizine (XYZAL) 5 MG tablet 1 tablet in the evening Patient not taking: Reported on 01/19/2021 05/06/20   [provider]  albuterol (PROVENTIL HFA;VENTOLIN HFA) 108 (90 Base) MCG/ACT inhaler Inhale 1 puff into the lungs every 6 (six) hours as needed for wheezing or shortness of breath.  03/20/19  [provider]  ferrous fumarate (HEMOCYTE - 106 MG FE) 325 (106 FE) MG TABS tablet Take 1 tablet by mouth daily.   03/20/19  [provider]  medroxyPROGESTERone (DEPO-PROVERA) 150 MG/ML injection Inject 150 mg into the muscle every 3 (three) months. 08/02/19 01/30/20  [provider]    Allergies    Patient has no known allergies.  Review of Systems   Review of Systems  Constitutional:  Negative for chills and fever.  Musculoskeletal:  Positive for arthralgias.  Skin:  Positive for wound.  Neurological:  Negative for weakness and numbness.  All other systems reviewed and are negative.  Physical Exam Updated Vital Signs BP 117/76   Pulse (!) 106   Temp  98.4 F (36.9 C)   Resp 12   Ht 5\' 9"  (1.753 m)   Wt 68 kg   LMP 09/08/2020   SpO2 100%   BMI 22.15 kg/m   Physical Exam Vitals and nursing note reviewed.  Constitutional:      Appearance: She is not ill-appearing or diaphoretic.  HENT:     Head: Normocephalic and atraumatic.  Eyes:     Conjunctiva/sclera: Conjunctivae normal.  Cardiovascular:     Rate and Rhythm: Normal rate and regular rhythm.     Pulses: Normal pulses.  Pulmonary:     Effort: Pulmonary effort is normal.     Breath sounds: Normal breath sounds. No wheezing, rhonchi or rales.  Musculoskeletal:     Comments: 0.5 cm laceration overlying proximal phalanx over L 2nd finger dorsal aspect; bleeding controlled. ROM intact to MCP, PIP, and DIP joint. Cap refill < 2 seconds. 2+ radial pulse.   Skin:    General: Skin is  warm and dry.     Coloration: Skin is not jaundiced.  Neurological:     Mental Status: She is alert.    ED Results / Procedures / Treatments   Labs (all labs ordered are listed, but only abnormal results are displayed) Labs Reviewed - No data to display  EKG None  Radiology No results found.  Procedures .07/09/2022Laceration Repair  Date/Time: 02/28/2021 6:38 PM Performed by: 03/02/2021, PA-C Authorized by: Tanda Rockers, PA-C   Consent:    Consent obtained:  Verbal   Risks discussed:  Infection, pain and poor cosmetic result Laceration details:    Location:  Finger   Finger location:  L index finger   Length (cm):  0.5   Depth (mm):  1 Skin repair:    Repair method:  Tissue adhesive Approximation:    Approximation:  Close Repair type:    Repair type:  Simple Post-procedure details:    Dressing:  Open (no dressing)   Procedure completion:  Tolerated well, no immediate complications   Medications Ordered in ED Medications  Tdap (BOOSTRIX) injection 0.5 mL (0.5 mLs Intramuscular Given 02/28/21 1825)    ED Course  I have reviewed the triage vital signs and the nursing notes.  Pertinent labs & imaging results that were available during my care of the patient were reviewed by me and considered in my medical decision making (see chart for details).    MDM Rules/Calculators/A&P                           25 year old female who is approximately [redacted] weeks pregnant presenting to the ED today secondary to left index finger laceration that occurred 30 minutes prior to arrival.  Tetanus status not up-to-date.  On arrival to the ED today vitals are stable.  Patient appears to be in no acute distress.  She has a 0.5 cm laceration to her left index finger, bleeding controlled.  The laceration is linear and superficial, will plan for Dermabond as I do not feel she requires stitches at this time.  We will plan to update tetanus as well.  Dermabond applied. Pt stable for discharge  home at this time. Laceration repair instructions given for patient. She is recommended to follow up with PCP for further eval. Recommended to return to the ED for any signs of infection. Pt in agreement with plan.   This note was prepared using Dragon voice recognition software and may include unintentional dictation errors due to the  inherent limitations of voice recognition software.   Final Clinical Impression(s) / ED Diagnoses Final diagnoses:  Laceration of left index finger without foreign body without damage to nail, initial encounter    Rx / DC Orders ED Discharge Orders     None        Discharge Instructions      Please see attached nonsutured laceration care. Do not get your wound wet for the next 24 hours. The glue will dissolve on its own in the next 5-6 days.   Follow up with your PCP regarding ED visit today  Return to the ED for any signs of infection to the wound including redness/swelling around the wound, drainage of pus, fevers > 100.4       Tanda Rockers, PA-C 02/28/21 1847    Benjiman Core, MD 03/01/21 [phone]

## 2021-02-28 NOTE — ED Triage Notes (Signed)
Pt presents with laceration to L index finger from using a kitchen knife. Bleeding controlled with light pressure and gauze. Pt unknown last tentanus shot. Pt is [redacted] weeks pregnant.

## 2021-03-02 ENCOUNTER — Telehealth: Payer: Self-pay

## 2021-03-02 NOTE — Telephone Encounter (Signed)
Transition Care Management Follow-up Telephone Call Date of discharge and from where: 02/28/2021 from [facility] How have you been since you were released from the hospital? Pt stated that she is feeling better.  Any questions or concerns? No  Items Reviewed: Did the pt receive and understand the discharge instructions provided? Yes  Medications obtained and verified? Yes  Other? No  Any new allergies since your discharge? No  Dietary orders reviewed? No Do you have support at home? Yes   Functional Questionnaire: (I = Independent and D = Dependent) ADLs: I Bathing/Dressing- I Meal Prep- I Eating- I Maintaining continence- I Transferring/Ambulation- I Managing Meds- I   Follow up appointments reviewed: PCP Hospital f/u appt confirmed? No   Specialist Hospital f/u appt confirmed? No   Are transportation arrangements needed? No  If their condition worsens, is the pt aware to call PCP or go to the Emergency Dept.? Yes Was the patient provided with contact information for the PCP's office or ED? Yes Was to pt encouraged to call back with questions or concerns? Yes

## 2021-03-09 IMAGING — DX DG CHEST 1V PORT
1 series · 1 of 1 positions shown · non-contrast
Comparison: 02/02/2020

CLINICAL DATA: Chest pain and [id number] positivity

EXAM:
PORTABLE CHEST 1 VIEW

[chest ap]
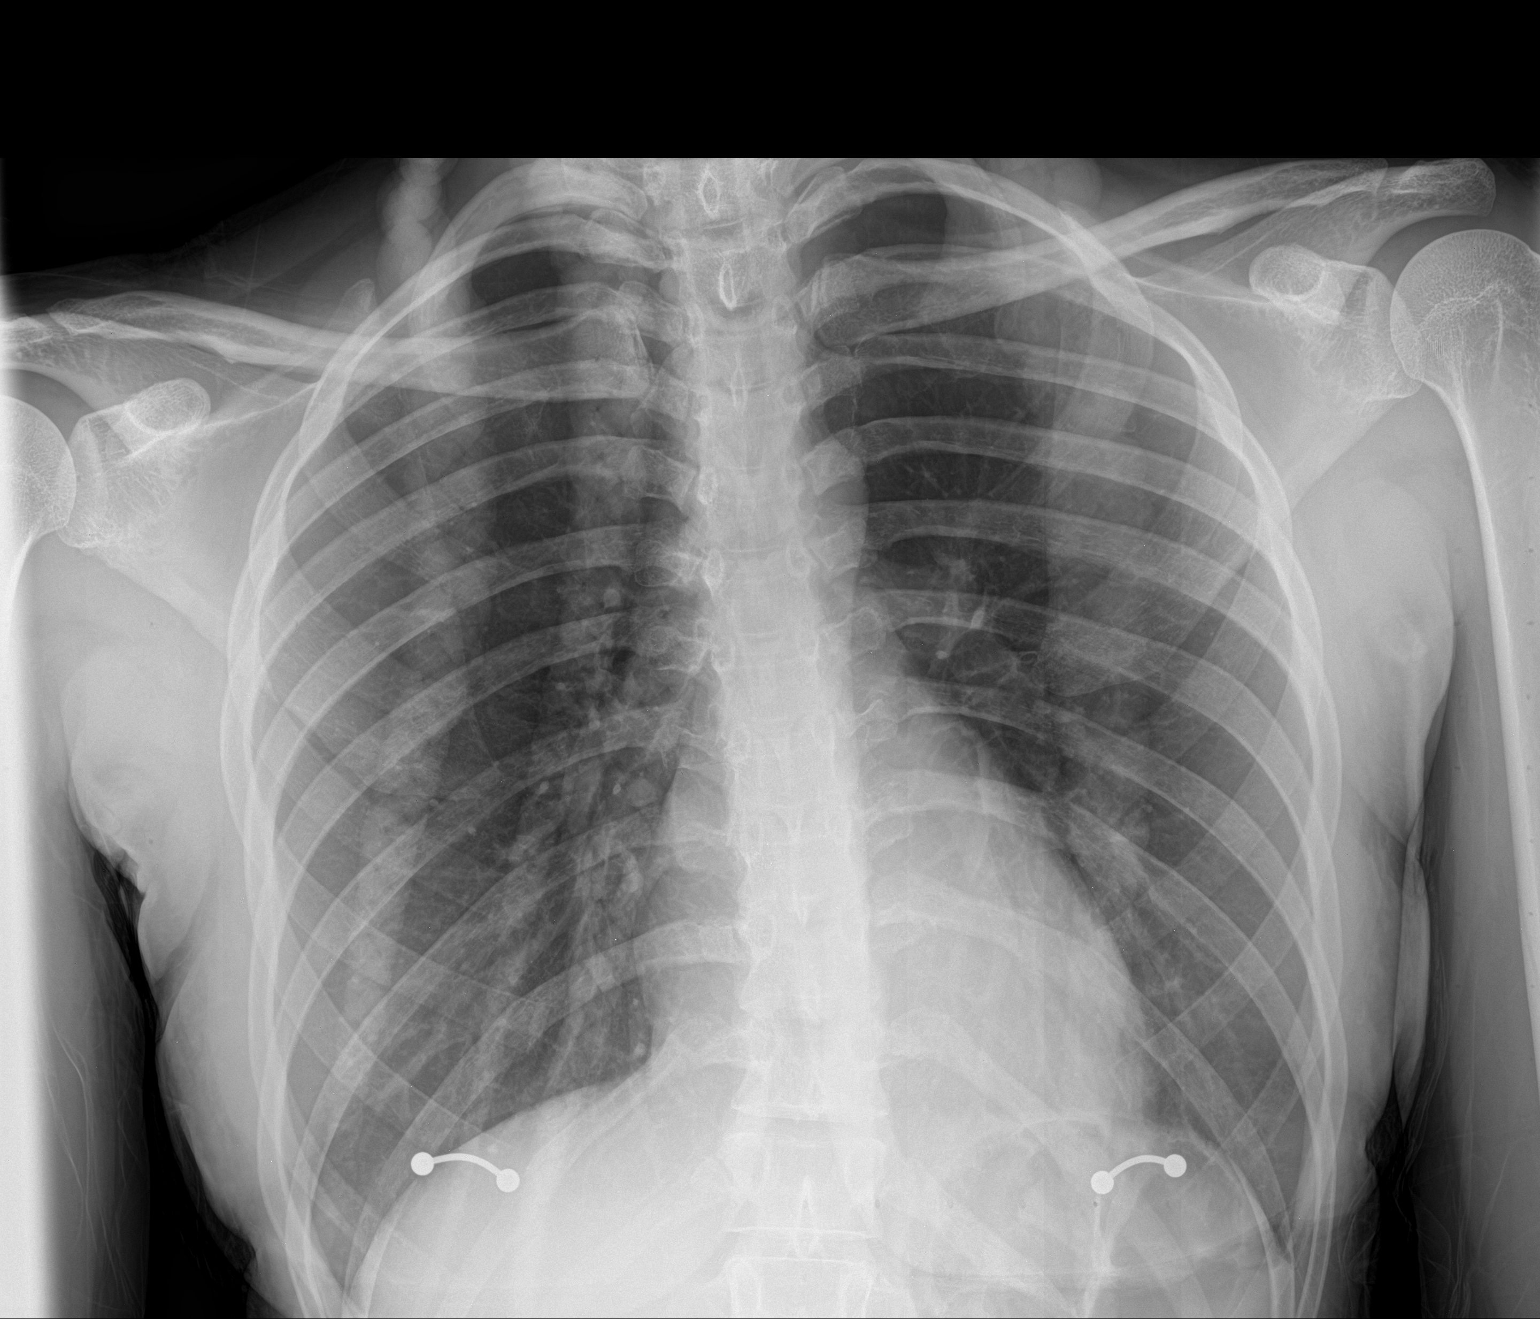

[1 of 1 positions shown; findings below may reference images not displayed]

FINDINGS: Cardiac shadow is within normal limits. Lungs are well aerated
bilaterally. Some minimal patchy airspace opacity is noted within
the right lung consistent with the given clinical history. No other
focal abnormality is noted.
IMPRESSION: Minimal patchy airspace opacity consistent with the given clinical
history of 002FK-0X positivity.

## 2021-03-17 DIAGNOSIS — N949 Unspecified condition associated with female genital organs and menstrual cycle: Secondary | ICD-10-CM | POA: Diagnosis not present

## 2021-03-17 DIAGNOSIS — Z3482 Encounter for supervision of other normal pregnancy, second trimester: Secondary | ICD-10-CM | POA: Diagnosis not present

## 2021-03-30 DIAGNOSIS — F4323 Adjustment disorder with mixed anxiety and depressed mood: Secondary | ICD-10-CM | POA: Diagnosis not present

## 2021-04-07 DIAGNOSIS — F4323 Adjustment disorder with mixed anxiety and depressed mood: Secondary | ICD-10-CM | POA: Diagnosis not present

## 2021-04-14 DIAGNOSIS — F4323 Adjustment disorder with mixed anxiety and depressed mood: Secondary | ICD-10-CM | POA: Diagnosis not present

## 2021-04-20 ENCOUNTER — Ambulatory Visit: Payer: 59 | Admitting: *Deleted

## 2021-04-20 ENCOUNTER — Encounter: Payer: Self-pay | Admitting: *Deleted

## 2021-04-20 ENCOUNTER — Other Ambulatory Visit: Payer: Self-pay | Admitting: *Deleted

## 2021-04-20 ENCOUNTER — Other Ambulatory Visit: Payer: Self-pay

## 2021-04-20 ENCOUNTER — Ambulatory Visit: Payer: 59 | Attending: Obstetrics and Gynecology

## 2021-04-20 VITALS — BP 107/63 | HR 89

## 2021-04-20 DIAGNOSIS — O352XX0 Maternal care for (suspected) hereditary disease in fetus, not applicable or unspecified: Secondary | ICD-10-CM | POA: Diagnosis present

## 2021-04-20 DIAGNOSIS — O368930 Maternal care for other specified fetal problems, third trimester, not applicable or unspecified: Secondary | ICD-10-CM

## 2021-04-20 DIAGNOSIS — Z3A32 32 weeks gestation of pregnancy: Secondary | ICD-10-CM | POA: Diagnosis not present

## 2021-04-20 DIAGNOSIS — O09299 Supervision of pregnancy with other poor reproductive or obstetric history, unspecified trimester: Secondary | ICD-10-CM

## 2021-04-20 DIAGNOSIS — O368990 Maternal care for other specified fetal problems, unspecified trimester, not applicable or unspecified: Secondary | ICD-10-CM | POA: Insufficient documentation

## 2021-04-20 DIAGNOSIS — O09293 Supervision of pregnancy with other poor reproductive or obstetric history, third trimester: Secondary | ICD-10-CM

## 2021-04-21 DIAGNOSIS — F4323 Adjustment disorder with mixed anxiety and depressed mood: Secondary | ICD-10-CM | POA: Diagnosis not present

## 2021-04-29 ENCOUNTER — Encounter (HOSPITAL_COMMUNITY): Payer: Self-pay | Admitting: Obstetrics and Gynecology

## 2021-04-29 ENCOUNTER — Other Ambulatory Visit: Payer: Self-pay

## 2021-04-29 ENCOUNTER — Inpatient Hospital Stay (HOSPITAL_COMMUNITY)
Admission: AD | Admit: 2021-04-29 | Discharge: 2021-05-06 | DRG: 786 | Disposition: A | Discharge status: Home / Self Care | Payer: 59 | Attending: Obstetrics and Gynecology | Admitting: Obstetrics and Gynecology

## 2021-04-29 DIAGNOSIS — Z3A33 33 weeks gestation of pregnancy: Secondary | ICD-10-CM | POA: Diagnosis not present

## 2021-04-29 DIAGNOSIS — Z20822 Contact with and (suspected) exposure to covid-19: Secondary | ICD-10-CM | POA: Diagnosis not present

## 2021-04-29 DIAGNOSIS — O42913 Preterm premature rupture of membranes, unspecified as to length of time between rupture and onset of labor, third trimester: Secondary | ICD-10-CM | POA: Diagnosis not present

## 2021-04-29 DIAGNOSIS — D509 Iron deficiency anemia, unspecified: Secondary | ICD-10-CM | POA: Diagnosis present

## 2021-04-29 DIAGNOSIS — O321XX0 Maternal care for breech presentation, not applicable or unspecified: Secondary | ICD-10-CM | POA: Diagnosis not present

## 2021-04-29 DIAGNOSIS — O9902 Anemia complicating childbirth: Secondary | ICD-10-CM | POA: Diagnosis present

## 2021-04-29 DIAGNOSIS — O9952 Diseases of the respiratory system complicating childbirth: Secondary | ICD-10-CM | POA: Diagnosis not present

## 2021-04-29 DIAGNOSIS — O42919 Preterm premature rupture of membranes, unspecified as to length of time between rupture and onset of labor, unspecified trimester: Secondary | ICD-10-CM | POA: Diagnosis not present

## 2021-04-29 DIAGNOSIS — O4593 Premature separation of placenta, unspecified, third trimester: Secondary | ICD-10-CM | POA: Diagnosis not present

## 2021-04-29 DIAGNOSIS — O09293 Supervision of pregnancy with other poor reproductive or obstetric history, third trimester: Secondary | ICD-10-CM | POA: Diagnosis not present

## 2021-04-29 DIAGNOSIS — J45909 Unspecified asthma, uncomplicated: Secondary | ICD-10-CM | POA: Diagnosis not present

## 2021-04-29 DIAGNOSIS — O4290 Premature rupture of membranes, unspecified as to length of time between rupture and onset of labor, unspecified weeks of gestation: Secondary | ICD-10-CM | POA: Diagnosis not present

## 2021-04-29 DIAGNOSIS — O42113 Preterm premature rupture of membranes, onset of labor more than 24 hours following rupture, third trimester: Secondary | ICD-10-CM | POA: Diagnosis not present

## 2021-04-29 DIAGNOSIS — Z98891 History of uterine scar from previous surgery: Secondary | ICD-10-CM

## 2021-04-29 LAB — CBC
HCT: 33.1 % — ABNORMAL LOW (ref 36.0–46.0)
Hemoglobin: 10.8 g/dL — ABNORMAL LOW (ref 12.0–15.0)
MCH: 30.3 pg (ref 26.0–34.0)
MCHC: 32.6 g/dL (ref 30.0–36.0)
MCV: 93 fL (ref 80.0–100.0)
Platelets: 224 10*3/uL (ref 150–400)
RBC: 3.56 MIL/uL — ABNORMAL LOW (ref 3.87–5.11)
RDW: 12.7 % (ref 11.5–15.5)
WBC: 8.7 10*3/uL (ref 4.0–10.5)
nRBC: 0 % (ref 0.0–0.2)

## 2021-04-29 LAB — RPR: RPR Ser Ql: NONREACTIVE

## 2021-04-29 LAB — TYPE AND SCREEN
ABO/RH(D): O POS
Antibody Screen: NEGATIVE

## 2021-04-29 LAB — WET PREP, GENITAL
Clue Cells Wet Prep HPF POC: NONE SEEN
Sperm: NONE SEEN
Trich, Wet Prep: NONE SEEN
WBC, Wet Prep HPF POC: >=10 — AB (ref <10)
Yeast Wet Prep HPF POC: NONE SEEN

## 2021-04-29 LAB — POCT FERN TEST

## 2021-04-29 LAB — RESP PANEL BY RT-PCR (FLU A&B, COVID) ARPGX2
Influenza A by PCR: NEGATIVE
Influenza B by PCR: NEGATIVE
SARS Coronavirus 2 by RT PCR: NEGATIVE

## 2021-04-29 MED ORDER — SODIUM CHLORIDE 0.9 % IV SOLN
2.0000 g | Freq: Four times a day (QID) | INTRAVENOUS | Status: AC
  Administered 2021-04-29 – 2021-04-30 (×8): 2 g via INTRAVENOUS
  Filled 2021-04-29 (×8): qty 2000

## 2021-04-29 MED ORDER — ACETAMINOPHEN 325 MG PO TABS: 650.0000 mg | ORAL_TABLET | ORAL | Status: DC | PRN

## 2021-04-29 MED ORDER — BETAMETHASONE SOD PHOS & ACET 6 (3-3) MG/ML IJ SUSP
12.0000 mg | INTRAMUSCULAR | Status: AC
  Administered 2021-04-29 – 2021-04-30 (×2): 12 mg via INTRAMUSCULAR
  Filled 2021-04-29: qty 5

## 2021-04-29 MED ORDER — AZITHROMYCIN 1 G PO PACK
1.0000 g | PACK | Freq: Once | ORAL | Status: AC
Start: 2021-04-29 — End: 2021-04-29
  Administered 2021-04-29: 03:00:00 1 g via ORAL
  Filled 2021-04-29: qty 1

## 2021-04-29 MED ORDER — AMOXICILLIN 500 MG PO CAPS
500.0000 mg | ORAL_CAPSULE | Freq: Three times a day (TID) | ORAL | Status: DC
  Administered 2021-05-01 – 2021-05-02 (×6): 500 mg via ORAL
  Filled 2021-04-29 (×7): qty 1

## 2021-04-29 MED ORDER — PRENATAL MULTIVITAMIN CH
1.0000 | ORAL_TABLET | Freq: Every day | ORAL | Status: DC
  Administered 2021-04-29 – 2021-05-02 (×4): 1 via ORAL
  Filled 2021-04-29 (×4): qty 1

## 2021-04-29 MED ORDER — ZOLPIDEM TARTRATE 5 MG PO TABS: 5.0000 mg | ORAL_TABLET | Freq: Every evening | ORAL | Status: DC | PRN

## 2021-04-29 MED ORDER — CALCIUM CARBONATE ANTACID 500 MG PO CHEW: 2.0000 | CHEWABLE_TABLET | ORAL | Status: DC | PRN

## 2021-04-29 MED ORDER — LACTATED RINGERS IV SOLN: INTRAVENOUS | Status: DC

## 2021-04-29 MED ORDER — DOCUSATE SODIUM 100 MG PO CAPS
100.0000 mg | ORAL_CAPSULE | Freq: Every day | ORAL | Status: DC
  Administered 2021-04-29 – 2021-05-02 (×4): 100 mg via ORAL
  Filled 2021-04-29 (×4): qty 1

## 2021-04-29 NOTE — H&P (Signed)
OB ADMISSION/ HISTORY & PHYSICAL:  Admission Date: 04/29/2021 12:44 AM  Admit Diagnosis: PPROM  Gloria Estes is a 25 y.o. [id number] at [redacted]w[redacted]d who presents for possible leaking of amniotic fluid. Reports she woke up at 1230 am & her pants were wet. Fluid has continued to come out & appears clear. Denies abdominal pain or vaginal bleeding. Denies recent intercourse. Reports good fetal movement.   Fern positive with clear leaking fluid visible. Breech presentation confirmed by [place] in MAU.   History of current pregnancy: [id number]   Primary OB Provider: Dr. Richardson Dopp [facility]) Patient entered care with [facility] at 9 wks.   EDC 06/13/2021 by [redacted]w[redacted]d U/S.     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 No pertinent past medical history.  Past surgical history:  Past Surgical History:  Procedure Laterality Date   DILATION AND CURETTAGE OF UTERUS     Family History:  Family History  Problem Relation Age of Onset   Healthy Mother    Healthy Father      Social History:  reports that she has never smoked. She has never used smokeless tobacco. She reports that she does not currently use alcohol. She reports that she does not use drugs.  Allergies: Tape   Current Medications at time of admission:  Prior to Admission medications   Medication Sig Start Date End Date Taking? Authorizing Provider  FLOVENT HFA 220 MCG/ACT inhaler Inhale 2 puffs into the lungs 2 (two) times daily. 11/10/20   [provider]  fluticasone 01/11/21) 50 MCG/ACT nasal spray 1-2 spray in each nostril Patient not taking: Reported on 01/19/2021 05/06/20   [provider]  levocetirizine (XYZAL) 5 MG tablet 1 tablet in the evening Patient not taking: Reported on 01/19/2021 05/06/20   [provider]  Prenatal 28-0.8 MG TABS Take 1 tablet by mouth daily.    [provider]  albuterol (PROVENTIL HFA;VENTOLIN HFA) 108 (90 Base) MCG/ACT inhaler Inhale 1 puff into the lungs every 6 (six) hours as needed for wheezing or shortness of breath.  03/20/19  [provider]  ferrous fumarate (HEMOCYTE - 106 MG FE) 325 (106 FE) MG TABS tablet Take 1 tablet by mouth daily.   03/20/19  [provider]  medroxyPROGESTERone (  DEPO-PROVERA) 150 MG/ML injection Inject 150 mg into the muscle every 3 (three) months. 08/02/19 01/30/20  [provider]    Review of Systems: Constitutional: Negative   HENT: Negative   Eyes: Negative   Respiratory: Negative   Cardiovascular: Negative   Gastrointestinal: Negative  Genitourinary: neg for bloody show, pos for LOF   Musculoskeletal: Negative   Skin: Negative   Neurological: Negative   Endo/Heme/Allergies: Negative   Psychiatric/Behavioral: Negative    Physical Exam: VS: Blood pressure 107/63, pulse 87, temperature 98.6 F (37 C), temperature source Oral, resp. rate 20, height 5\' 9"  (1.753 m), weight 72.6 kg, last menstrual period 09/08/2020, unknown if currently breastfeeding. AAO x3, no  signs of distress Cardiovascular: RRR Respiratory: Lung fields clear to ausculation GU/GI: Abdomen gravid, non-tender, non-distended, active FM, breech Extremities: no edema, negative for pain, tenderness, and cords  Cervical exam: not performed FHR: baseline rate 140 / variability moderate / accelerations present / no decelerations TOCO: no contractions   Prenatal Transfer Tool  Maternal Diabetes: No Genetic Screening: Declined Maternal Ultrasounds/Referrals: Normal Fetal Ultrasounds or other Referrals:  None Maternal Substance Abuse:  No Significant Maternal Medications:  None Significant Maternal Lab Results: Other:  GBS pending    Assessment: 25 y.o. 30 [redacted]w[redacted]d for PPROM without labor.    Plan:  Admit to OBSC GBS pending PO azithromycin 1g once now IV ampicillin 2g every 6 hours for 48 hours then PO amoxicillin 500mg  q 8 hours for 5 days BMZ now and repeat dose 24 hours Bed rest with BR privileges LR at 174ml/hr   Dr notified of admission and plan of care  32m MSN, CNM 04/29/2021 3:22 AM

## 2021-04-29 NOTE — Progress Notes (Signed)
Carol JIMMA ORTMAN is a 25 y.o. female G4P1021 at 15 weeks and 2 day admitted with PPROM and breech presentation.   Subjective: pt denies contractions or abdominal tenderness continued leakage of fluid.   Vitals:   04/29/21 0340 04/29/21 0846 04/29/21 1144 04/29/21 1556  BP: 114/65 108/66 115/63 114/63  Pulse: 80 100 (!) 108 98  Resp: 18 18 18 18   Temp: 98.7 F (37.1 C) 99 F (37.2 C) 97.6 F (36.4 C) (!) 97.5 F (36.4 C)  TempSrc: Oral Oral Oral Oral  SpO2: 100% 99% 100% 100%  Weight:      Height:        General : alert and oriented no acute distress.  Lungs: no distress  Abdomen gravid nontender  Ext no evidence of dvt  FHR NST from the am reviewed.  baseline 130 accelerations present no decelerations.  Toco: occasional contraction   Results for orders placed or performed during the hospital encounter of 04/29/21 (from the past 24 hour(s))  POCT fern test     Status: Abnormal   Collection Time: 04/29/21  1:15 AM  Result Value Ref Range   POCT [facility] prep, genital     Status: Abnormal   Collection Time: 04/29/21  1:30 AM   Specimen: Vaginal  Result Value Ref Range   Yeast Wet Prep HPF POC NONE SEEN NONE SEEN   Trich, Wet Prep NONE SEEN NONE SEEN   Clue Cells Wet Prep HPF POC NONE SEEN NONE SEEN   WBC, Wet Prep HPF POC >=10 (A) <10   Sperm NONE SEEN   Type and screen [facility]     Status: None   Collection Time: 04/29/21  1:30 AM  Result Value Ref Range   ABO/RH(D) O POS    Antibody Screen NEG    Sample Expiration      05/02/2021,2359 Performed at [facility], [address], [place], [place]   CBC     Status: Abnormal   Collection Time: 04/29/21  1:30 AM  Result Value Ref Range   WBC 8.7 4.0 - 10.5 K/uL   RBC 3.56 (L) 3.87 - 5.11 MIL/uL   Hemoglobin 10.8 (L) 12.0 - 15.0 g/dL   HCT 05/01/21 (L) 89.3 - 81.0 %   MCV 93.0 80.0 - 100.0 fL   MCH 30.3 26.0 - 34.0 pg   MCHC 32.6 30.0 - 36.0 g/dL   RDW 17.5 10.2 - 58.5 %    Platelets 224 150 - 400 K/uL   nRBC 0.0 0.0 - 0.2 %  RPR     Status: None   Collection Time: 04/29/21  1:30 AM  Result Value Ref Range   RPR Ser Ql NON REACTIVE NON REACTIVE  Resp Panel by RT-PCR (Flu A&B, Covid) PATH Cytology Cervicovaginal Ancillary Only     Status: None   Collection Time: 04/29/21  1:33 AM   Specimen: PATH Cytology Cervicovaginal Ancillary Only; Nasopharyngeal(NP) swabs in vial transport medium  Result Value Ref Range   SARS Coronavirus 2 by RT PCR NEGATIVE NEGATIVE   Influenza A by PCR NEGATIVE NEGATIVE   Influenza B by PCR NEGATIVE NEGATIVE    A/P 33 weeks and 2 days with PPROM / breech presentation.  No evidence of chorio or labor  If pt develops chorio or labor and is breech recommend cesarean section. D//w pt r/b/a/ of cesarean section including but not limited to infection / bleeeding damage to bowel bladder baby with  the need for further surgery.  - continue steriod for flm  - plan u/s tomorrow for afi and presentation  - NST q shift  - Will monitor closely

## 2021-04-29 NOTE — MAU Provider Note (Signed)
History     CSN: [id number]  Arrival date and time: 04/29/21 0044   Event Date/Time   First Provider Initiated Contact with Patient 04/29/21 0126      Chief Complaint  Patient presents with   Rupture of Membranes   HPI Gloria Estes is a 25 y.o. [id number] at [redacted]w[redacted]d who presents for possible leaking of amniotic fluid. Reports she woke up at 1230 am & her pants were wet. Fluid has continued to come out & appears clear. Denies abdominal pain or vaginal bleeding. Denies recent intercourse. Reports good fetal movement. Sees Dr. Richardson Dopp for prenatal care.   OB History     Gravida  4   Para  1   Term  1   Preterm      AB  2   Living  1      SAB      IAB  2   Ectopic      Multiple      Live Births  1           History reviewed. No pertinent past medical history.  Past Surgical History:  Procedure Laterality Date   DILATION AND CURETTAGE OF UTERUS      Family History  Problem Relation Age of Onset   Healthy Mother    Healthy Father     Social History   Tobacco Use   Smoking status: Never   Smokeless tobacco: Never  Vaping Use   Vaping Use: Never used  Substance Use Topics   Alcohol use: Not Currently    Comment: social   Drug use: Never    Allergies:  Allergies  Allergen Reactions   Tape Itching and Other (See Comments)    Medications Prior to Admission  Medication Sig Dispense Refill Last Dose   FLOVENT HFA 220 MCG/ACT inhaler Inhale 2 puffs into the lungs 2 (two) times daily.   More than a month   fluticasone (FLONASE) 50 MCG/ACT nasal spray 1-2 spray in each nostril (Patient not taking: Reported on 01/19/2021)      levocetirizine (XYZAL) 5 MG tablet 1 tablet in the evening (Patient not taking: Reported on 01/19/2021)      Prenatal 28-0.8 MG TABS Take 1 tablet by mouth daily.       Review of Systems  Constitutional: Negative.   Gastrointestinal: Negative.   Genitourinary:  Positive for vaginal discharge.  Physical Exam   Blood pressure  118/78, pulse (!) 110, temperature 98.3 F (36.8 C), temperature source Oral, resp. rate 20, height 5\' 9"  (1.753 m), weight 72.6 kg, last menstrual period 09/08/2020, unknown if currently breastfeeding.  Physical Exam Vitals and nursing note reviewed. Exam conducted with a chaperone present.  Constitutional:      General: She is not in acute distress.    Appearance: Normal appearance. She is not toxic-appearing.  HENT:     Head: Normocephalic and atraumatic.  Eyes:     General: No scleral icterus.    Conjunctiva/sclera: Conjunctivae normal.     Pupils: Pupils are equal, round, and reactive to light.  Pulmonary:     Effort: Pulmonary effort is normal. No respiratory distress.  Abdominal:     Palpations: Abdomen is soft.     Tenderness: There is no abdominal tenderness.  Genitourinary:    Comments: Clear fluid leaking from introitus. Vaginal swabs collected blindly.  Skin:    General: Skin is warm and dry.  Neurological:     General: No focal deficit present.  Mental Status: She is alert.  Psychiatric:        Mood and Affect: Mood normal.        Behavior: Behavior normal.   NST:  Baseline: 135 bpm, Variability: Good {> 6 bpm), Accelerations: Reactive, and Decelerations: Absent  Ultrasound Pt informed that the ultrasound is considered a limited OB ultrasound and is not intended to be a complete ultrasound exam.  Patient also informed that the ultrasound is not being completed with the intent of assessing for fetal or placental anomalies or any pelvic abnormalities.  Explained that the purpose of todays ultrasound is to assess for  presentation.  Patient acknowledges the purpose of the exam and the limitations of the study.    Impression: breech  MAU Course  Procedures Results for orders placed or performed during the hospital encounter of 04/29/21 (from the past 24 hour(s))  POCT fern test     Status: Abnormal   Collection Time: 04/29/21  1:15 AM  Result Value Ref Range    POCT [employer] prep, genital     Status: Abnormal   Collection Time: 04/29/21  1:30 AM  Result Value Ref Range   Yeast Wet Prep HPF POC NONE SEEN NONE SEEN   Trich, Wet Prep NONE SEEN NONE SEEN   Clue Cells Wet Prep HPF POC NONE SEEN NONE SEEN   WBC, Wet Prep HPF POC >=10 (A) <10   Sperm NONE SEEN     MDM Patient presents for PPROM at [redacted]w[redacted]d. She is grossly rupture leaking clear fluid. Fern positive. Wet prep, GC/CT, & GBS culture collected.  BSUS performed, breech presentation.   Dr. Normand Sloop notified & will admit patient  Assessment and Plan   1. Preterm premature rupture of membranes (PPROM) with unknown onset of labor   2. [redacted] weeks gestation of pregnancy    -Pt to be admitted per Dr. Meyer Russel 04/29/2021, 1:26 AM

## 2021-04-29 NOTE — MAU Note (Signed)
PT SAYS AT 0030- WAS LAYING DOWN AND WOKE WITH CLOTHES WET  PNC WITH DR Richardson Dopp  LAST SEX- 2 MTHS AGO

## 2021-04-30 ENCOUNTER — Inpatient Hospital Stay (HOSPITAL_BASED_OUTPATIENT_CLINIC_OR_DEPARTMENT_OTHER): Payer: 59

## 2021-04-30 DIAGNOSIS — Z3A33 33 weeks gestation of pregnancy: Secondary | ICD-10-CM | POA: Diagnosis not present

## 2021-04-30 DIAGNOSIS — O09293 Supervision of pregnancy with other poor reproductive or obstetric history, third trimester: Secondary | ICD-10-CM

## 2021-04-30 DIAGNOSIS — O4593 Premature separation of placenta, unspecified, third trimester: Secondary | ICD-10-CM | POA: Diagnosis not present

## 2021-04-30 DIAGNOSIS — O42913 Preterm premature rupture of membranes, unspecified as to length of time between rupture and onset of labor, third trimester: Secondary | ICD-10-CM

## 2021-04-30 DIAGNOSIS — O4290 Premature rupture of membranes, unspecified as to length of time between rupture and onset of labor, unspecified weeks of gestation: Secondary | ICD-10-CM | POA: Diagnosis not present

## 2021-04-30 LAB — GC/CHLAMYDIA PROBE AMP (~~LOC~~) NOT AT ARMC
Chlamydia: NEGATIVE
Comment: NEGATIVE
Comment: NORMAL
Neisseria Gonorrhea: NEGATIVE

## 2021-04-30 LAB — URINALYSIS, ROUTINE W REFLEX MICROSCOPIC
Bilirubin Urine: NEGATIVE
Glucose, UA: NEGATIVE mg/dL
Hgb urine dipstick: NEGATIVE
Ketones, ur: NEGATIVE mg/dL
Leukocytes,Ua: NEGATIVE
Nitrite: NEGATIVE
Protein, ur: NEGATIVE mg/dL
Specific Gravity, Urine: 1.003 — ABNORMAL LOW (ref 1.005–1.030)
pH: 7 (ref 5.0–8.0)

## 2021-04-30 MED ORDER — LACTATED RINGERS IV BOLUS
500.0000 mL | Freq: Once | INTRAVENOUS | Status: AC
Start: 2021-04-30 — End: 2021-04-30
  Administered 2021-04-30: 16:00:00 500 mL via INTRAVENOUS

## 2021-04-30 NOTE — Progress Notes (Signed)
Gloria Estes is a 25 y.o. female G4P1021 at 31 weeks and 2 day admitted with PPROM and breech presentation  Subjective: pt denies contractions or abdominal tenderness. She has continued leakage of fluid. She reports some burning with urination   Vitals:   04/29/21 2353 04/30/21 0336 04/30/21 1127 04/30/21 1515  BP: (!) 102/53 (!) 101/51 118/63 110/63  Pulse: 86 76 99 84  Resp: 18 16 16 16   Temp: 98.2 F (36.8 C) 98.3 F (36.8 C) 98.1 F (36.7 C) 98 F (36.7 C)  TempSrc: Oral Oral Oral Oral  SpO2: 100% 100% 100% 100%  Weight:      Height:        General : alert and oriented no acute distress.  Lungs: no distress  Abdomen gravid nontender  Ext no evidence of dvt  FHR Baseline 130's accelerations present  decelerations absent  Toco:  no contractions noted    BPP this afternoon 8 out if 10 ( 2 off for oligo)   A/P 33 weeks and 3 days with PPROM / breech presentation.  No evidence of chorio or labor  If pt develops chorio or labor and is breech recommend cesarean section. D//w pt r/b/a/ of cesarean section including but not limited to infection / bleeeding damage to bowel bladder baby with the need for further surgery.  - continue latency antibiotics - BMZ completed   - NST q shift  - Will monitor closely  - per Dr. he will see her in the morning to discuss timing of delivery.

## 2021-04-30 NOTE — Consult Note (Signed)
Antenatal Consultation: Reason: PPROM Requested by: Dr. Scotty Court  I met with Ms. Karp who has PPROM and a breech presentation at 34 weeks.  We discussed the short and long term consequences of preterm delivery, as well as the expected mangement and duration of hospitalization for babies born at 33-[redacted] weeks EGA.  We discussed the risks of chorioamnionitis with PPROM.  I told her that her doctors and nurses are monitoring her closely for any complications that would affect the timing of delivery. I also told her that babies that are delivered at later gestations in general have better outcomes, and that the decision to deliver at 34 weeks depended on multiple factors, and that some women choose to remain pregnant to optimize fetal outcome.  I spoke with Dr. Landry Mellow, who told me she plans to seek guidance from maternal fetal medicine about the expectant management approach in this case.  R.L. Darrelyn Hillock.D.

## 2021-05-01 DIAGNOSIS — O42113 Preterm premature rupture of membranes, onset of labor more than 24 hours following rupture, third trimester: Secondary | ICD-10-CM | POA: Diagnosis not present

## 2021-05-01 DIAGNOSIS — Z3A33 33 weeks gestation of pregnancy: Secondary | ICD-10-CM | POA: Diagnosis not present

## 2021-05-01 LAB — CULTURE, OB URINE: Culture: NO GROWTH

## 2021-05-01 NOTE — Progress Notes (Signed)
Gloria Estes is a 25 y.o. female G4P1021 at 45 weeks and 4 day admitted with PPROM and breech presentation.  Subjective: Patient feeling well. Reports feeling confused about timing of delivery but leaning towards desire for delivery at 34 weeks as opposed to continuing the pregnancy further. Reports leakage of clear fluid. Burning with urination has resolved. Endorses active FM. Denies VB or CTX.  Denies fevers, chills, chest pain, visual changes, SOB, RUQ/epigastric pain, N/V, dysuria, hematuria, or sudden onset/worsening bilateral LE or facial edema.  Vitals:   04/30/21 1127 04/30/21 1515 04/30/21 1949 05/01/21 0447  BP: 118/63 110/63 (!) 109/55 (!) 90/41  Pulse: 99 84 96 77  Resp: 16 16 17 16   Temp: 98.1 F (36.7 C) 98 F (36.7 C) 98.8 F (37.1 C) 98.4 F (36.9 C)  TempSrc: Oral Oral Oral Oral  SpO2: 100% 100% 100% 100%  Weight:      Height:        General: alert and oriented no acute distress Cardio:  RRR Lungs: CTAB, no wheezes/rales/rhonchi Abdomen: gravid, soft, non-tender throughout, no fundal tenderness Ext: No bilateral LE edema, no bilateral calf tenderness  FHT (2100 on 12/29): 145bpm, moderate variability, + accel, - decel  Toco:  no contractions noted    A/P 33 weeks and 4 days with PPROM/breech presentation.  - No evidence of chorio or labor  - If pt develops chorio or labor and is breech recommend cesarean section, previously consented for such - Continue latency antibiotics - BMZ x 2 doses completed   - NST q shift  - Will monitor closely  - Dr. 1/30 to see patient this morning to discuss timing of delivery  Parke Poisson, DO

## 2021-05-01 NOTE — Progress Notes (Signed)
Corrected

## 2021-05-01 NOTE — Consult Note (Signed)
MFM Note  Gloria Estes is a 25 year old gravida 4 para 1-0-2-1 currently at 33 weeks and 4 days.  She was admitted 2 days ago due to PPROM.  She has received a complete course of antenatal corticosteroids and is is currently treated with latency antibiotics.  The fetal status has been reassuring and she does not show any signs of an intrauterine infection.  She had an ultrasound performed yesterday afternoon that shows that the fetus is in the breech presentation.  Oligohydramnios was noted.  An EFW obtained on April 20, 2021 was 4 pounds 1 ounces (32nd percentile).  Her prior child was born with tetralogy of Fallot which required surgical repair.  That child is doing well today.  She had a normal fetal echocardiogram performed in her current pregnancy.  The patient was seen in consultation today to discuss the timing of delivery due to PPROM.  The usual management and implications of PPROM were discussed with the patient. She was advised that due to rupture of membranes, she will require inpatient management until delivery with daily fetal testing.    She was advised that usually, women who have ruptured membranes are delivered at around 34 weeks.  During her discussion with the NICU yesterday, the option delaying delivery beyond 34 weeks to improve the neonatal outcome was discussed.    She was advised that the main benefit of delaying delivery beyond 34 weeks would be to decrease the length of stay in the NICU for her baby and to prevent short-term neonatal respiratory issues.    The increased risk of an adverse outcome such as a cord prolapse, intrauterine infection, and the need for an urgent cesarean delivery by delaying delivery until a later gestational age was discussed.  Delaying delivery until 35 to 36 weeks in someone with PPROM has been supported by the most recent ACOG practice bulletin regarding preterm premature rupture of membranes, as long as the patient has been appropriately  counseled regarding the risks versus benefits of waiting.  A reasonable compromise may be to delay delivery until the end of next week when she will be 34 weeks and 4 days.  She will make a final decision regarding the timing of delivery after she discusses this with you.  If the patient desires to wait beyond 34 weeks for delivery, she should undergo daily fetal testing until delivery.  She understands that delivery would be indicated at any time for nonreassuring fetal status, should she go into spontaneous labor, or should she show any signs of an intrauterine infection.  At the end of the consultation, the patient stated that all her questions had been answered to her complete satisfaction.    Thank you for referring this patient for a Maternal-Fetal Medicine consultation.

## 2021-05-02 LAB — CULTURE, BETA STREP (GROUP B ONLY)

## 2021-05-02 LAB — TYPE AND SCREEN
ABO/RH(D): O POS
Antibody Screen: NEGATIVE

## 2021-05-02 MED ORDER — BISACODYL 10 MG RE SUPP: 10.0000 mg | Freq: Once | RECTAL | Status: DC

## 2021-05-02 MED ORDER — BISACODYL 10 MG RE SUPP
10.0000 mg | Freq: Once | RECTAL | Status: AC
  Administered 2021-05-02: 10 mg via RECTAL
  Filled 2021-05-02: qty 1

## 2021-05-02 MED ORDER — LACTATED RINGERS IV BOLUS
1000.0000 mL | Freq: Once | INTRAVENOUS | Status: AC
  Administered 2021-05-02: 1000 mL via INTRAVENOUS

## 2021-05-02 NOTE — Progress Notes (Signed)
Gloria Estes is a 25 y.o. female G4P1021 at 78 weeks and 5 day admitted with PPROM and breech presentation.\  Subjective:  she denies abdominal tenderness or contractions. She has continued leakage of fluid. She would like to have delivery at 34 weeks instead of waiting.   Vitals:   05/02/21 0839 05/02/21 1129 05/02/21 1511 05/02/21 1945  BP: (!) 110/54 (!) 98/46 (!) 106/56 (!) 100/55  Pulse: 84 (!) 112 (!) 116 87  Resp: 15 16 16 16   Temp: 98.2 F (36.8 C) 97.8 F (36.6 C) 97.9 F (36.6 C) 98.3 F (36.8 C)  TempSrc: Oral Oral Oral Oral  SpO2: 99% 100% 100% 99%  Weight:      Height:        General: alert and oriented no acute distress Cardio:  RRR Lungs: CTAB, no wheezes/rales/rhonchi Abdomen: gravid, soft, non-tender throughout, no fundal tenderness Ext: No bilateral LE edema, no bilateral calf tenderness  NST reactive this AM   A/P 33 weeks and 5 days with PPROM / breech presentation.  No evidence of chorio or labor  If pt develops chorio or labor and is breech recommend cesarean section. D//w pt r/b/a/ of cesarean section including but not limited to infection / bleeeding damage to bowel bladder baby with the need for further surgery.  - continue latency antibiotics - BMZ completed   - NST q shift  - Will monitor closely  - pt has opted for delivery at 34 weeks via cesarean section due to breech presentation. It has been scheduled for 9am on January 2nd.

## 2021-05-03 ENCOUNTER — Encounter (HOSPITAL_COMMUNITY): Payer: Self-pay | Admitting: Anesthesiology

## 2021-05-03 ENCOUNTER — Inpatient Hospital Stay (HOSPITAL_COMMUNITY): Payer: 59 | Admitting: Anesthesiology

## 2021-05-03 ENCOUNTER — Encounter (HOSPITAL_COMMUNITY)
Admission: AD | Disposition: A | Discharge status: Home / Self Care | Payer: Self-pay | Source: Home / Self Care | Attending: Obstetrics and Gynecology

## 2021-05-03 LAB — CBC
HCT: 29.1 % — ABNORMAL LOW (ref 36.0–46.0)
HCT: 31.4 % — ABNORMAL LOW (ref 36.0–46.0)
Hemoglobin: 9.6 g/dL — ABNORMAL LOW (ref 12.0–15.0)
Hemoglobin: 10.4 g/dL — ABNORMAL LOW (ref 12.0–15.0)
MCH: 30.6 pg (ref 26.0–34.0)
MCH: 30.4 pg (ref 26.0–34.0)
MCHC: 33 g/dL (ref 30.0–36.0)
MCHC: 33.1 g/dL (ref 30.0–36.0)
MCV: 92.7 fL (ref 80.0–100.0)
MCV: 91.8 fL (ref 80.0–100.0)
Platelets: 188 10*3/uL (ref 150–400)
Platelets: 207 10*3/uL (ref 150–400)
RBC: 3.14 MIL/uL — ABNORMAL LOW (ref 3.87–5.11)
RBC: 3.42 MIL/uL — ABNORMAL LOW (ref 3.87–5.11)
RDW: 13 % (ref 11.5–15.5)
RDW: 12.8 % (ref 11.5–15.5)
WBC: 11.4 10*3/uL — ABNORMAL HIGH (ref 4.0–10.5)
WBC: 16.7 10*3/uL — ABNORMAL HIGH (ref 4.0–10.5)
nRBC: 0.3 % — ABNORMAL HIGH (ref 0.0–0.2)
nRBC: 0 % (ref 0.0–0.2)

## 2021-05-03 SURGERY — Surgical Case
Anesthesia: Regional | Wound class: Clean Contaminated

## 2021-05-03 MED ORDER — FENTANYL CITRATE (PF) 100 MCG/2ML IJ SOLN: 25.0000 ug | INTRAMUSCULAR | Status: DC | PRN

## 2021-05-03 MED ORDER — PRENATAL MULTIVITAMIN CH
1.0000 | ORAL_TABLET | Freq: Every day | ORAL | Status: DC
  Administered 2021-05-03 – 2021-05-05 (×3): 1 via ORAL
  Filled 2021-05-03 (×3): qty 1

## 2021-05-03 MED ORDER — FENTANYL CITRATE (PF) 100 MCG/2ML IJ SOLN
INTRAMUSCULAR | Status: DC | PRN
  Administered 2021-05-03: 15 ug via INTRATHECAL

## 2021-05-03 MED ORDER — NALOXONE HCL 4 MG/10ML IJ SOLN
1.0000 ug/kg/h | INTRAVENOUS | Status: DC | PRN
  Filled 2021-05-03: qty 5

## 2021-05-03 MED ORDER — KETOROLAC TROMETHAMINE 30 MG/ML IJ SOLN
INTRAMUSCULAR | Status: AC
  Filled 2021-05-03: qty 1

## 2021-05-03 MED ORDER — EPHEDRINE SULFATE 50 MG/ML IJ SOLN
INTRAMUSCULAR | Status: DC | PRN
  Administered 2021-05-03: 10 mg via INTRAVENOUS

## 2021-05-03 MED ORDER — ACETAMINOPHEN 160 MG/5ML PO SOLN: 325.0000 mg | ORAL | Status: DC | PRN

## 2021-05-03 MED ORDER — PHENYLEPHRINE HCL-NACL 20-0.9 MG/250ML-% IV SOLN
INTRAVENOUS | Status: DC | PRN
  Administered 2021-05-03: 60 ug/min via INTRAVENOUS

## 2021-05-03 MED ORDER — BUPIVACAINE IN DEXTROSE 0.75-8.25 % IT SOLN
INTRATHECAL | Status: DC | PRN
  Administered 2021-05-03: 1.6 mL via INTRATHECAL

## 2021-05-03 MED ORDER — SOD CITRATE-CITRIC ACID 500-334 MG/5ML PO SOLN: 30.0000 mL | ORAL | Status: DC

## 2021-05-03 MED ORDER — METHYLERGONOVINE MALEATE 0.2 MG/ML IJ SOLN
0.2000 mg | INTRAMUSCULAR | Status: DC | PRN
  Administered 2021-05-03: 0.2 mg via INTRAMUSCULAR
  Filled 2021-05-03: qty 1

## 2021-05-03 MED ORDER — DIPHENHYDRAMINE HCL 25 MG PO CAPS
25.0000 mg | ORAL_CAPSULE | Freq: Four times a day (QID) | ORAL | Status: DC | PRN
  Filled 2021-05-03: qty 1

## 2021-05-03 MED ORDER — MORPHINE SULFATE (PF) 0.5 MG/ML IJ SOLN
INTRAMUSCULAR | Status: DC | PRN
  Administered 2021-05-03: .15 mg via INTRATHECAL

## 2021-05-03 MED ORDER — METOCLOPRAMIDE HCL 5 MG/ML IJ SOLN
INTRAMUSCULAR | Status: DC | PRN
  Administered 2021-05-03: 10 mg via INTRAVENOUS

## 2021-05-03 MED ORDER — SENNOSIDES-DOCUSATE SODIUM 8.6-50 MG PO TABS
2.0000 | ORAL_TABLET | Freq: Every day | ORAL | Status: DC
  Administered 2021-05-04 – 2021-05-06 (×3): 2 via ORAL
  Filled 2021-05-03 (×4): qty 2

## 2021-05-03 MED ORDER — DIPHENHYDRAMINE HCL 25 MG PO CAPS
25.0000 mg | ORAL_CAPSULE | ORAL | Status: DC | PRN
  Administered 2021-05-03: 25 mg via ORAL

## 2021-05-03 MED ORDER — DEXAMETHASONE SODIUM PHOSPHATE 10 MG/ML IJ SOLN
INTRAMUSCULAR | Status: DC | PRN
Start: 2021-05-03 — End: 2021-05-03
  Administered 2021-05-03: 10 mg via INTRAVENOUS

## 2021-05-03 MED ORDER — KETOROLAC TROMETHAMINE 30 MG/ML IJ SOLN
30.0000 mg | Freq: Four times a day (QID) | INTRAMUSCULAR | Status: AC
  Administered 2021-05-03 – 2021-05-04 (×4): 30 mg via INTRAVENOUS
  Filled 2021-05-03 (×4): qty 1

## 2021-05-03 MED ORDER — COCONUT OIL OIL
1.0000 "application " | TOPICAL_OIL | Status: DC | PRN
  Administered 2021-05-03: 1 via TOPICAL

## 2021-05-03 MED ORDER — SODIUM CHLORIDE 0.9% FLUSH: 3.0000 mL | INTRAVENOUS | Status: DC | PRN

## 2021-05-03 MED ORDER — FLEET ENEMA 7-19 GM/118ML RE ENEM: 1.0000 | ENEMA | Freq: Every day | RECTAL | Status: DC | PRN

## 2021-05-03 MED ORDER — PROMETHAZINE HCL 25 MG/ML IJ SOLN: 6.2500 mg | INTRAMUSCULAR | Status: DC | PRN

## 2021-05-03 MED ORDER — MORPHINE SULFATE (PF) 0.5 MG/ML IJ SOLN
INTRAMUSCULAR | Status: AC
  Filled 2021-05-03: qty 10

## 2021-05-03 MED ORDER — SIMETHICONE 80 MG PO CHEW
80.0000 mg | CHEWABLE_TABLET | Freq: Three times a day (TID) | ORAL | Status: DC
  Administered 2021-05-03 – 2021-05-06 (×10): 80 mg via ORAL
  Filled 2021-05-03 (×11): qty 1

## 2021-05-03 MED ORDER — FENTANYL CITRATE (PF) 100 MCG/2ML IJ SOLN
INTRAMUSCULAR | Status: AC
  Filled 2021-05-03: qty 2

## 2021-05-03 MED ORDER — DEXAMETHASONE SODIUM PHOSPHATE 4 MG/ML IJ SOLN: INTRAMUSCULAR | Status: DC | PRN

## 2021-05-03 MED ORDER — MENTHOL 3 MG MT LOZG: 1.0000 | LOZENGE | OROMUCOSAL | Status: DC | PRN

## 2021-05-03 MED ORDER — ACETAMINOPHEN 500 MG PO TABS
1000.0000 mg | ORAL_TABLET | Freq: Four times a day (QID) | ORAL | Status: DC
  Administered 2021-05-03 – 2021-05-06 (×12): 1000 mg via ORAL
  Filled 2021-05-03 (×13): qty 2

## 2021-05-03 MED ORDER — SODIUM CHLORIDE 0.9 % IV SOLN
500.0000 mg | INTRAVENOUS | Status: AC
  Administered 2021-05-03: 500 mg via INTRAVENOUS

## 2021-05-03 MED ORDER — SODIUM CHLORIDE 0.9 % IV SOLN
3.0000 g | Freq: Four times a day (QID) | INTRAVENOUS | Status: AC
  Administered 2021-05-03 – 2021-05-04 (×4): 3 g via INTRAVENOUS
  Filled 2021-05-03 (×5): qty 8

## 2021-05-03 MED ORDER — OXYTOCIN-SODIUM CHLORIDE 30-0.9 UT/500ML-% IV SOLN: 2.5000 [IU]/h | INTRAVENOUS | Status: AC

## 2021-05-03 MED ORDER — ONDANSETRON HCL 4 MG/2ML IJ SOLN: 4.0000 mg | Freq: Three times a day (TID) | INTRAMUSCULAR | Status: DC | PRN

## 2021-05-03 MED ORDER — METOCLOPRAMIDE HCL 5 MG/ML IJ SOLN
INTRAMUSCULAR | Status: AC
  Filled 2021-05-03: qty 2

## 2021-05-03 MED ORDER — SOD CITRATE-CITRIC ACID 500-334 MG/5ML PO SOLN
ORAL | Status: AC
  Filled 2021-05-03: qty 30

## 2021-05-03 MED ORDER — MEPERIDINE HCL 25 MG/ML IJ SOLN: 6.2500 mg | INTRAMUSCULAR | Status: DC | PRN

## 2021-05-03 MED ORDER — OXYCODONE HCL 5 MG PO TABS
5.0000 mg | ORAL_TABLET | ORAL | Status: DC | PRN
  Administered 2021-05-03 – 2021-05-04 (×2): 5 mg via ORAL
  Filled 2021-05-03 (×2): qty 1

## 2021-05-03 MED ORDER — LACTATED RINGERS IV SOLN: INTRAVENOUS | Status: DC

## 2021-05-03 MED ORDER — ACETAMINOPHEN 500 MG PO TABS: 1000.0000 mg | ORAL_TABLET | Freq: Four times a day (QID) | ORAL | Status: DC

## 2021-05-03 MED ORDER — KETOROLAC TROMETHAMINE 30 MG/ML IJ SOLN
30.0000 mg | Freq: Four times a day (QID) | INTRAMUSCULAR | Status: AC | PRN
  Administered 2021-05-03: 30 mg via INTRAMUSCULAR

## 2021-05-03 MED ORDER — ZOLPIDEM TARTRATE 5 MG PO TABS: 5.0000 mg | ORAL_TABLET | Freq: Every evening | ORAL | Status: DC | PRN

## 2021-05-03 MED ORDER — LACTATED RINGERS IV SOLN: INTRAVENOUS | Status: DC | PRN

## 2021-05-03 MED ORDER — PHENYLEPHRINE HCL-NACL 20-0.9 MG/250ML-% IV SOLN
INTRAVENOUS | Status: AC
  Filled 2021-05-03: qty 250

## 2021-05-03 MED ORDER — METHYLERGONOVINE MALEATE 0.2 MG PO TABS: 0.2000 mg | ORAL_TABLET | ORAL | Status: DC | PRN

## 2021-05-03 MED ORDER — KETOROLAC TROMETHAMINE 30 MG/ML IJ SOLN: 30.0000 mg | Freq: Four times a day (QID) | INTRAMUSCULAR | Status: AC | PRN

## 2021-05-03 MED ORDER — DIPHENHYDRAMINE HCL 50 MG/ML IJ SOLN
12.5000 mg | INTRAMUSCULAR | Status: DC | PRN
  Administered 2021-05-04: 12.5 mg via INTRAVENOUS
  Filled 2021-05-03: qty 1

## 2021-05-03 MED ORDER — IBUPROFEN 600 MG PO TABS
600.0000 mg | ORAL_TABLET | Freq: Four times a day (QID) | ORAL | Status: DC
Start: 2021-05-04 — End: 2021-05-06
  Administered 2021-05-04 – 2021-05-06 (×8): 600 mg via ORAL
  Filled 2021-05-03 (×8): qty 1

## 2021-05-03 MED ORDER — CEFAZOLIN SODIUM-DEXTROSE 2-4 GM/100ML-% IV SOLN
2.0000 g | INTRAVENOUS | Status: AC
  Administered 2021-05-03: 2 g via INTRAVENOUS

## 2021-05-03 MED ORDER — ONDANSETRON HCL 4 MG/2ML IJ SOLN
INTRAMUSCULAR | Status: AC
  Filled 2021-05-03: qty 2

## 2021-05-03 MED ORDER — HYDROMORPHONE HCL 1 MG/ML IJ SOLN
0.2000 mg | INTRAMUSCULAR | Status: DC | PRN
  Administered 2021-05-03 – 2021-05-04 (×4): 0.6 mg via INTRAVENOUS
  Filled 2021-05-03 (×5): qty 1

## 2021-05-03 MED ORDER — ACETAMINOPHEN 10 MG/ML IV SOLN
1000.0000 mg | Freq: Once | INTRAVENOUS | Status: DC | PRN
  Administered 2021-05-03: 1000 mg via INTRAVENOUS

## 2021-05-03 MED ORDER — DEXAMETHASONE SODIUM PHOSPHATE 10 MG/ML IJ SOLN
INTRAMUSCULAR | Status: AC
  Filled 2021-05-03: qty 1

## 2021-05-03 MED ORDER — EPHEDRINE 5 MG/ML INJ
INTRAVENOUS | Status: AC
  Filled 2021-05-03: qty 5

## 2021-05-03 MED ORDER — FERROUS SULFATE 325 (65 FE) MG PO TABS
325.0000 mg | ORAL_TABLET | Freq: Two times a day (BID) | ORAL | Status: DC
Start: 2021-05-03 — End: 2021-05-05
  Administered 2021-05-03 – 2021-05-04 (×4): 325 mg via ORAL
  Filled 2021-05-03 (×4): qty 1

## 2021-05-03 MED ORDER — NALOXONE HCL 0.4 MG/ML IJ SOLN: 0.4000 mg | INTRAMUSCULAR | Status: DC | PRN

## 2021-05-03 MED ORDER — OXYTOCIN-SODIUM CHLORIDE 30-0.9 UT/500ML-% IV SOLN
INTRAVENOUS | Status: AC
  Filled 2021-05-03: qty 500

## 2021-05-03 MED ORDER — SODIUM CHLORIDE 0.9 % IV SOLN
INTRAVENOUS | Status: AC
  Filled 2021-05-03: qty 5

## 2021-05-03 MED ORDER — ACETAMINOPHEN 325 MG PO TABS: 325.0000 mg | ORAL_TABLET | ORAL | Status: DC | PRN

## 2021-05-03 MED ORDER — METHYLERGONOVINE MALEATE 0.2 MG/ML IJ SOLN: 0.2000 mg | INTRAMUSCULAR | Status: DC

## 2021-05-03 MED ORDER — BISACODYL 10 MG RE SUPP: 10.0000 mg | Freq: Every day | RECTAL | Status: DC | PRN

## 2021-05-03 MED ORDER — SCOPOLAMINE 1 MG/3DAYS TD PT72: 1.0000 | MEDICATED_PATCH | Freq: Once | TRANSDERMAL | Status: DC

## 2021-05-03 MED ORDER — ACETAMINOPHEN 10 MG/ML IV SOLN
INTRAVENOUS | Status: AC
  Filled 2021-05-03: qty 100

## 2021-05-03 MED ORDER — ONDANSETRON HCL 4 MG/2ML IJ SOLN
INTRAMUSCULAR | Status: DC | PRN
Start: 2021-05-03 — End: 2021-05-03
  Administered 2021-05-03: 4 mg via INTRAVENOUS

## 2021-05-03 MED ORDER — METHYLERGONOVINE MALEATE 0.2 MG PO TABS
0.2000 mg | ORAL_TABLET | ORAL | Status: DC
  Administered 2021-05-03 – 2021-05-04 (×8): 0.2 mg via ORAL
  Filled 2021-05-03 (×8): qty 1

## 2021-05-03 MED ORDER — SIMETHICONE 80 MG PO CHEW: 80.0000 mg | CHEWABLE_TABLET | ORAL | Status: DC | PRN

## 2021-05-03 SURGICAL SUPPLY — 39 items
APL SKNCLS STERI-STRIP NONHPOA (GAUZE/BANDAGES/DRESSINGS) ×1
BARRIER ADHS 3X4 INTERCEED (GAUZE/BANDAGES/DRESSINGS) IMPLANT
BENZOIN TINCTURE PRP APPL 2/3 (GAUZE/BANDAGES/DRESSINGS) ×2 IMPLANT
BINDER ABDOMINAL 10 UNV 27-48 (MISCELLANEOUS) IMPLANT
BINDER ABDOMINAL 12 ML 46-62 (SOFTGOODS) IMPLANT
BRR ADH 4X3 ABS CNTRL BYND (GAUZE/BANDAGES/DRESSINGS)
CHLORAPREP W/TINT 26ML (MISCELLANEOUS) ×2 IMPLANT
CLAMP CORD UMBIL (MISCELLANEOUS) IMPLANT
CLOTH BEACON ORANGE TIMEOUT ST (SAFETY) ×2 IMPLANT
CLSR STERI-STRIP ANTIMIC 1/2X4 (GAUZE/BANDAGES/DRESSINGS) ×1 IMPLANT
DRSG OPSITE POSTOP 4X10 (GAUZE/BANDAGES/DRESSINGS) ×2 IMPLANT
ELECT REM PT RETURN 9FT ADLT (ELECTROSURGICAL) ×2
ELECTRODE REM PT RTRN 9FT ADLT (ELECTROSURGICAL) ×1 IMPLANT
EXTRACTOR VACUUM KIWI (MISCELLANEOUS) IMPLANT
GLOVE BIOGEL M 7.0 STRL (GLOVE) ×4 IMPLANT
GLOVE BIOGEL PI IND STRL 7.0 (GLOVE) ×3 IMPLANT
GLOVE BIOGEL PI INDICATOR 7.0 (GLOVE) ×3
GOWN STRL REUS W/TWL LRG LVL3 (GOWN DISPOSABLE) ×6 IMPLANT
KIT ABG SYR 3ML LUER SLIP (SYRINGE) IMPLANT
NDL HYPO 25X5/8 SAFETYGLIDE (NEEDLE) IMPLANT
NEEDLE HYPO 25X5/8 SAFETYGLIDE (NEEDLE) IMPLANT
NS IRRIG 1000ML POUR BTL (IV SOLUTION) ×2 IMPLANT
PACK C SECTION WH (CUSTOM PROCEDURE TRAY) ×2 IMPLANT
PAD OB MATERNITY 4.3X12.25 (PERSONAL CARE ITEMS) ×2 IMPLANT
PENCIL SMOKE EVAC W/HOLSTER (ELECTROSURGICAL) ×2 IMPLANT
RTRCTR C-SECT PINK 25CM LRG (MISCELLANEOUS) IMPLANT
STRIP CLOSURE SKIN 1/2X4 (GAUZE/BANDAGES/DRESSINGS) ×2 IMPLANT
SUT MNCRL 0 VIOLET CTX 36 (SUTURE) ×2 IMPLANT
SUT MONOCRYL 0 CTX 36 (SUTURE) ×8
SUT PDS AB 0 CT1 27 (SUTURE) ×4 IMPLANT
SUT PLAIN 0 NONE (SUTURE) IMPLANT
SUT VIC AB 2-0 CT1 27 (SUTURE) ×2
SUT VIC AB 2-0 CT1 TAPERPNT 27 (SUTURE) ×1 IMPLANT
SUT VIC AB 3-0 SH 27 (SUTURE)
SUT VIC AB 3-0 SH 27X BRD (SUTURE) IMPLANT
SUT VIC AB 4-0 KS 27 (SUTURE) ×2 IMPLANT
TOWEL OR 17X24 6PK STRL BLUE (TOWEL DISPOSABLE) ×2 IMPLANT
TRAY FOLEY W/BAG SLVR 14FR LF (SET/KITS/TRAYS/PACK) ×2 IMPLANT
WATER STERILE IRR 1000ML POUR (IV SOLUTION) ×2 IMPLANT

## 2021-05-03 NOTE — Lactation Note (Signed)
This note was copied from a baby's chart.  NICU Lactation Consultation Note  Patient Name: Gloria Estes [id number] Date: 05/03/2021 Age:26   Subjective Reason for consult: Initial assessment; NICU baby Mother bf her last baby for 15 months and has initiated pumping today for this baby. She requests a stork pump; RN placed referral. Mother denies hx of breast surgery/trauma.   Objective Infant data: Mother's Current Feeding Choice: Breast Milk  Maternal data: [id number]  C-Section, Low Transverse Significant Breast History:: normal milk production with last baby  Does the patient have breastfeeding experience prior to this delivery?: Yes How long did the patient breastfeed?: 15 months  Pumping frequency: q3 + hand expression today Pumped volume: 8 mL  Risk factor for low milk supply:: blood loss: risk of delay of onset of copious milk.      Assessment Feeding Status: NPO   Maternal: Milk volume: Normal Mother has initiated pumping and is collecting a normal amount of colostrum today. She may transition to mature milk later because of blood loss during delivery.   Intervention/Plan Interventions: Education Banker placed order for [employer] pump)  Tools: Pump Pump Education: Setup, frequency, and cleaning; Milk Storage  Plan: Consult Status: Follow-up (verify receipt of stork pump)  NICU Follow-up type: New admission follow up; Verify absence of engorgement; Verify onset of copious milk; Assist with IDF-1 (Mother to pre-pump before breastfeeding)  Mother to pump q3 LC to plan f/u visit to assist with lick and learn.   Elder Negus 05/03/2021, 12:14 PM

## 2021-05-03 NOTE — Anesthesia Preprocedure Evaluation (Addendum)
Anesthesia Evaluation  Patient identified by MRN, date of birth, ID band Patient awake    Reviewed: Allergy & Precautions, NPO status , Patient's Chart, lab work & pertinent test results  Airway Mallampati: I       Dental no notable dental hx.    Pulmonary neg pulmonary ROS,    Pulmonary exam normal        Cardiovascular negative cardio ROS Normal cardiovascular exam     Neuro/Psych negative neurological ROS     GI/Hepatic   Endo/Other    Renal/GU      Musculoskeletal   Abdominal   Peds  Hematology negative hematology ROS (+)   Anesthesia Other Findings   Reproductive/Obstetrics (+) Pregnancy                           Anesthesia Physical Anesthesia Plan  ASA: 2 and emergent  Anesthesia Plan:    Post-op Pain Management:    Induction:   PONV Risk Score and Plan: 0  Airway Management Planned: Natural Airway  Additional Equipment: None  Intra-op Plan:   Post-operative Plan:   Informed Consent: I have reviewed the patients History and Physical, chart, labs and discussed the procedure including the risks, benefits and alternatives for the proposed anesthesia with the patient or authorized representative who has indicated his/her understanding and acceptance.       Plan Discussed with: CRNA  Anesthesia Plan Comments: (Lab Results      Component                Value               Date                      WBC                      8.7                 04/29/2021                HGB                      10.8 (L)            04/29/2021                HCT                      33.1 (L)            04/29/2021                MCV                      93.0                04/29/2021                PLT                      224                 04/29/2021           )       Anesthesia Quick Evaluation

## 2021-05-03 NOTE — Anesthesia Procedure Notes (Signed)
Spinal  Start time: 05/03/2021 12:50 AM End time: 05/03/2021 12:52 AM Reason for block: surgical anesthesia Staffing Performed: anesthesiologist  Anesthesiologist: Shelton Silvas, MD Preanesthetic Checklist Completed: patient identified, IV checked, site marked, risks and benefits discussed, surgical consent, monitors and equipment checked, pre-op evaluation and timeout performed Spinal Block Patient position: sitting Prep: DuraPrep and site prepped and draped Location: L3-4 Injection technique: single-shot Needle Needle type: Pencan  Needle gauge: 24 G Needle length: 10 cm Needle insertion depth: 10 cm Additional Notes Patient tolerated well. No immediate complications.  Functioning IV was confirmed and monitors were applied. Sterile prep and drape, including hand hygiene and sterile gloves were used. The patient was positioned and the back was prepped. The skin was anesthetized with lidocaine. Free flow of clear CSF was obtained prior to injecting local anesthetic into the CSF. The spinal needle aspirated freely following injection. The needle was carefully withdrawn. The patient tolerated the procedure well.

## 2021-05-03 NOTE — Progress Notes (Signed)
Stork breast pump ordered per Advertising copywriter. [employer] notified by RN via phone call at 1120.

## 2021-05-03 NOTE — Anesthesia Postprocedure Evaluation (Signed)
Anesthesia Post Note  Patient: Gloria Estes  Procedure(s) Performed: CESAREAN SECTION     Patient location during evaluation: PACU Anesthesia Type: Spinal Level of consciousness: oriented and awake and alert Pain management: pain level controlled Vital Signs Assessment: post-procedure vital signs reviewed and stable Respiratory status: spontaneous breathing, respiratory function stable and patient connected to nasal cannula oxygen Cardiovascular status: blood pressure returned to baseline and stable Postop Assessment: no headache, no backache and no apparent nausea or vomiting Anesthetic complications: no   No notable events documented.  Last Vitals:  Vitals:   05/03/21 0330 05/03/21 0345  BP: (!) 111/50   Pulse: 66 64  Resp: (!) 23 (!) 23  Temp:    SpO2: 99% 98%    Last Pain:  Vitals:   05/03/21 0345  TempSrc:   PainSc: 2    Pain Goal: Patients Stated Pain Goal: 3 (05/01/21 1010)  LLE Motor Response: Non-purposeful movement (05/03/21 0330) LLE Sensation: Tingling (05/03/21 0330) RLE Motor Response: Non-purposeful movement (05/03/21 0330) RLE Sensation: Tingling (05/03/21 0330) L Sensory Level: T12-Inguinal (groin) region (05/03/21 0330) R Sensory Level: T12-Inguinal (groin) region (05/03/21 0330) Epidural/Spinal Function Cutaneous sensation: Able to Wiggle Toes (05/03/21 0345), Patient able to flex knees: Yes (05/03/21 0345), Patient able to lift hips off bed: Yes (05/03/21 0345), Back pain beyond tenderness at insertion site: No (05/03/21 0345), Progressively worsening motor and/or sensory loss: No (05/03/21 0345), Bowel and/or bladder incontinence post epidural: No (05/03/21 0330)  Shelton Silvas

## 2021-05-03 NOTE — Transfer of Care (Signed)
Immediate Anesthesia Transfer of Care Note  Patient: Gloria Estes  Procedure(s) Performed: CESAREAN SECTION  Patient Location: PACU  Anesthesia Type:Spinal  Level of Consciousness: awake, alert  and oriented  Airway & Oxygen Therapy: Patient Spontanous Breathing  Post-op Assessment: Report given to RN and Post -op Vital signs reviewed and stable  Post vital signs: Reviewed and stable  Last Vitals:  Vitals Value Taken Time  BP 91/48 05/03/21 0200  Temp    Pulse 80 05/03/21 0203  Resp    SpO2 100 % 05/03/21 0203  Vitals shown include unvalidated device data.  Last Pain:  Vitals:   05/02/21 1945  TempSrc: Oral  PainSc: 0-No pain      Patients Stated Pain Goal: 3 (05/01/21 1010)  Complications: No notable events documented.

## 2021-05-03 NOTE — Op Note (Signed)
Cesarean Section Procedure Note  Indications:  33 weeks and 6 days with PPROM/ PTL and Breech presentation   Pre-operative Diagnosis: 33 week 6 day pregnancy.  Post-operative Diagnosis: same  Surgeon: Gerald Leitz M.D.  Assistants: [facility] CMW assisted due to the complexity of the anatomy   Anesthesia: Spinal anesthesia  ASA Class: 2   Procedure Details   The patient was seen in the Holding Room. The risks, benefits, complications, treatment options, and expected outcomes were discussed with the patient.  The patient concurred with the proposed plan, giving informed consent.  The site of surgery properly noted/marked. The patient was taken to Operating Room # B, identified as Gloria Estes and the procedure verified as C-Section Delivery. A Time Out was held and the above information confirmed.  After induction of anesthesia, the patient was draped and prepped in the usual sterile manner. A Pfannenstiel incision was made and carried down through the subcutaneous tissue to the fascia. Fascial incision was made and extended transversely. The fascia was separated from the underlying rectus tissue superiorly and inferiorly. The peritoneum was identified and entered. Peritoneal incision was extended longitudinally. The utero-vesical peritoneal reflection was incised transversely and the bladder flap was bluntly freed from the lower uterine segment. A low transverse uterine incision was made. Delivered from breech  presentation was a 1980 gram Female with Apgar scores of 6 at one minute and 8 at five minutes. After the umbilical cord was clamped and cut cord blood was obtained for evaluation. The placenta was removed intact and appeared normal. The uterine outline, tubes and ovaries appeared normal. The uterine incision was closed with running locked sutures of  0 monocryl. A second layer of 0 monocryl was used to imbricate the incision  . Hemostasis was observed. Lavage was carried out until  clear. The fascia was then reapproximated with running sutures of  0 pds . The skin was reapproximated with  4-0 vicryl .  Instrument, sponge, and needle counts were correct prior the abdominal closure and at the conclusion of the case.   Findings: Female infant in the breech presentation.. normal appearing fallopian tubes and ovaries   Estimated Blood Loss:   397 mL         Drains: None         Total IV Fluids:  per anesthesia ml         Specimens: Placenta and Disposition:  Sent to Pathology          Implants: None         Complications:  None; patient tolerated the procedure well.         Disposition: PACU - hemodynamically stable.         Condition: stable  Attending Attestation: I performed the procedure.

## 2021-05-03 NOTE — Progress Notes (Signed)
In to assess patient due to nonreassuring NST. Pt also complaining of contractions. Upon arrival pt sitting on the side of the bed. Abdomen is tender to palpation. NST reviewed and is category 2 despite IV fluid bolus and positioning. Recommend cesarean section due to breech presentation and labor and nonreassuring fetal heart rate.    R/B/A of cesarean section discussed with the patient including but not limited to infection, bleeding damage to bowel bladder and baby with the need for further surgery. R/O transfusion HIV/ Hep B&C discussed. Pt voiced understanding and desires to proceed with cesarean section.

## 2021-05-03 NOTE — Progress Notes (Signed)
Subjective: Postpartum Day 0: Cesarean Delivery Patient reports incisional pain and tolerating PO.   Per nurse postpartum atony with ebl 437 patient given 2mg  Methergine IM with resolution of bleeding.   Objective: Vital signs in last 24 hours: Temp:  [97.9 F (36.6 C)-99 F (37.2 C)] 98.7 F (37.1 C) (01/01 1131) Pulse Rate:  [56-116] 65 (01/01 1131) Resp:  [14-25] 16 (01/01 1131) BP: (82-126)/(37-68) 115/57 (01/01 1131) SpO2:  [96 %-100 %] 100 % (01/01 1131)  Physical Exam:  General: alert, cooperative, and no distress Lochia: appropriate Uterine Fundus: firm Incision: healing well DVT Evaluation: No evidence of DVT seen on physical exam.  Recent Labs    05/03/21 0026 05/03/21 0547  HGB 9.6* 10.4*  HCT 29.1* 31.4*    Assessment/Plan: Status post Cesarean section. Doing well postoperatively. Delayed postpartum hemorrhage. Resolved with methergine will continue methergine 2 mg po every 6 hours scheduled for 24 hours    Pain well controlled.  Once foley is removed encourage ambulation .  07/01/21 05/03/2021, 1:05 PM

## 2021-05-03 NOTE — Progress Notes (Signed)
Multiple large clots noted, EBL 437 per triton. Fundus firm with massage U/3. Dr. Richardson Dopp notified, methergine protocol initiated.

## 2021-05-04 MED ORDER — DOCUSATE SODIUM 100 MG PO CAPS
100.0000 mg | ORAL_CAPSULE | Freq: Two times a day (BID) | ORAL | Status: DC
  Administered 2021-05-04 – 2021-05-06 (×4): 100 mg via ORAL
  Filled 2021-05-04 (×4): qty 1

## 2021-05-04 MED ORDER — CYCLOBENZAPRINE HCL 10 MG PO TABS
5.0000 mg | ORAL_TABLET | Freq: Once | ORAL | Status: AC
  Administered 2021-05-04: 5 mg via ORAL
  Filled 2021-05-04: qty 1

## 2021-05-04 MED ORDER — LACTATED RINGERS IV BOLUS
1000.0000 mL | Freq: Once | INTRAVENOUS | Status: AC
  Administered 2021-05-04: 1000 mL via INTRAVENOUS

## 2021-05-04 NOTE — Progress Notes (Signed)
Subjective: Postpartum Day 1: Cesarean Delivery Patient reports incisional pain, tolerating PO, and no problems voiding.   Patient reports feeling tense in her neck and shoulder. Rates pain in abdomen as 7 out of 10. Bleeding has decreased significantly   Objective: Vital signs in last 24 hours: Temp:  [98.3 F (36.8 C)] 98.3 F (36.8 C) (01/02 1206) Pulse Rate:  [58-65] 65 (01/02 1206) Resp:  [16-18] 18 (01/02 1206) BP: (98-111)/(38-56) 102/38 (01/02 1206) SpO2:  [99 %-100 %] 100 % (01/02 1206)  Physical Exam:  General: alert, cooperative, and no distress Lochia: appropriate Uterine Fundus: firm Incision: healing well DVT Evaluation: No evidence of DVT seen on physical exam.  Recent Labs    05/03/21 0026 05/03/21 0547  HGB 9.6* 10.4*  HCT 29.1* 31.4*    Assessment/Plan: Status post Cesarean section. Doing well postoperatively.  Pain control. Continue tylenol and ibuoprofen scheduled. Oxycodone as needed.  Will add flexeril at night due to "tense neck and shoulder muscles " - encouraged ambulation  - plan discharge home pod #2 or 3 .  Gerald Leitz 05/04/2021, 12:46 PM

## 2021-05-04 NOTE — Social Work (Deleted)
Patient screened out for psychosocial assessment since none of the following apply: Psychosocial stressors documented in mother or baby's chart Gestation less than 32 weeks Code at delivery  Infant with anomalies Please contact the Clinical Social Worker if specific needs arise, by MOB's request, or if MOB scores greater than 9/yes to question 10 on Edinburgh Postpartum Depression Screen.  Darra Lis, MSW, [place] Work [employer] [phone]

## 2021-05-04 NOTE — Lactation Note (Signed)
This note was copied from a baby's chart.  NICU Lactation Consultation Note  Patient Name: Girl Jasmia Angst [id number] Date: 05/04/2021 Age:24 hours   Subjective Reason for consult: Follow-up assessment; NICU baby Mother has pumped frequently during the past 24 hours. She denies breast discomfort or pumping difficulty.   Mother states that baby latched in NICU yesterday. She will continue to offer opportunities. We discussed normal feeding behaviors and expectation at 34 weeks. Mother is aware of LC services and will request assistance today prn.  Mother has not received a stork pump at this time. LC placed additional request with RN; RN to place order.   Objective Infant data: Mother's Current Feeding Choice: Breast Milk and Donor Milk  Infant feeding assessment Scale for Readiness: 3    Maternal data: [id number]  C-Section, Low Transverse Significant Breast History:: normal milk production with last baby  Does the patient have breastfeeding experience prior to this delivery?: Yes How long did the patient breastfeed?: 15 months  Pumping frequency: q3 pumping + hand expression: drops of colostrum today, as expected. Pumped volume: 8 mL  Risk factor for low milk supply:: blood loss: risk of delay of onset of copious milk.    Assessment Infant: Feeding Status: NPO   Maternal: Milk volume: Normal   Intervention/Plan Interventions: Education  Tools: Pump Pump Education: Setup, frequency, and cleaning; Milk Storage  Plan: Consult Status: Follow-up  NICU Follow-up type: Verify absence of engorgement; Verify onset of copious milk; Assist with IDF-2 (Mother does not need to pre-pump before breastfeeding)  Mother to continue pumping q3 LC to verify receipt of stork pump prior to d/c.  Elder Negus 05/04/2021, 10:39 AM

## 2021-05-05 LAB — CBC
HCT: 25.5 % — ABNORMAL LOW (ref 36.0–46.0)
Hemoglobin: 8.2 g/dL — ABNORMAL LOW (ref 12.0–15.0)
MCH: 30.6 pg (ref 26.0–34.0)
MCHC: 32.2 g/dL (ref 30.0–36.0)
MCV: 95.1 fL (ref 80.0–100.0)
Platelets: 187 10*3/uL (ref 150–400)
RBC: 2.68 MIL/uL — ABNORMAL LOW (ref 3.87–5.11)
RDW: 13.5 % (ref 11.5–15.5)
WBC: 9.2 10*3/uL (ref 4.0–10.5)
nRBC: 0.2 % (ref 0.0–0.2)

## 2021-05-05 MED ORDER — SODIUM CHLORIDE 0.9 % IV SOLN
500.0000 mg | Freq: Once | INTRAVENOUS | Status: AC
  Administered 2021-05-05: 500 mg via INTRAVENOUS
  Filled 2021-05-05: qty 25

## 2021-05-05 NOTE — Clinical Social Work Maternal (Signed)
CLINICAL SOCIAL WORK MATERNAL/CHILD NOTE  Patient Details  Name: Gloria Estes MRN: [id number] Date of Birth: 02/28/1996  Date:  11/26/2021  Clinical Social Worker Initiating Note:  Abundio Miu, [place] Date/Time: Initiated:  05/04/21/1031     Child's Name:  Gloria Estes   Biological Parents:  Mother, Father (Father: Lidia Clavijo)   Need for Interpreter:  None   Reason for Referral:  Other (Comment), [place] Concerns (Infant's NICU Admission)   Address:  [place] [address]    Phone number:  [phone]  Additional phone number:   Household Members/Support Persons (HM/SP):   Household Member/Support Person 1   HM/SP Name Relationship DOB or Age  HM/SP -1 Jacquenette Shone [place] son 10/28/17  HM/SP -2        HM/SP -3        HM/SP -4        HM/SP -5        HM/SP -6        HM/SP -7        HM/SP -8          Natural Supports (not living in the home):      Professional Supports: Therapist   Employment: Other (comment)   Type of Work: [place]   Education:  [place] arranged:    Pensions consultant:  Multimedia programmer , [employer]   Other Resources:  Physicist, medical     Cultural/Religious Considerations Which May Impact Care:    Strengths:  Ability to meet basic needs  , Engineer, materials, Understanding of illness   Psychotropic Medications:         Pediatrician:    Solicitor area  Pediatrician List:   [facility] [place]      Pediatrician Fax Number:    Risk Factors/Current Problems:  Mental Health Concerns     Cognitive State:  Able to Concentrate  , Alert  , Insightful  , Goal Oriented  , Linear Thinking     Mood/Affect:  Calm  , Interested  , Comfortable  , Relaxed     CSW Assessment: CSW met with MOB at bedside to complete psychosocial assessment, FOB present. CSW  introduced self and explained role. MOB was welcoming, pleasant, and remained engaged during assessment. MOB reported that she resides with her son and works PRN as a Presenter, broadcasting for [employer]. MOB reported that she receives food stamps and has an upcoming appointment with [place] on 1/4. Parents reported that they have started to shop for infant and have some of the bigger items. CSW informed parents about [facility] [employer] [facility] [employer]. Parents reported that a referral for all essential items would be helpful, CSW agreed to make referral.   CSW and parents discussed infant's NICU admission. CSW informed parents about the NICU, what to expect, and supports available while infant is admitted to the NICU. Parents reported that they feel well informed about infant's care and denied any transportation barriers with visiting infant in the NICU. Parents denied any questions regarding the NICU.   CSW asked FOB to leave the room to speak with MOB privately, FOB left the room.   CSW inquired about MOB's mental health history. MOB denied any mental health history. CSW inquired about any history of postpartum depression. MOB reported that she experienced postpartum depression  for a few months after her last pregnancy. MOB described her postpartum depression as having anxiety, irrational fears, and over worrying. MOB reported that she participated in therapy which has been helpful. MOB reported that she plans to continue therapy with her therapist (Shapel Bynum). CSW inquired about how MOB was feeling emotionally since giving birth, MOB reported that she was feeling good. MOB presented calm and did not demonstrate any acute mental health signs/symptoms. CSW assessed for safety, MOB denied SI, HI, and domestic violence.  ° °CSW provided education regarding the baby blues period vs. perinatal mood disorders, discussed treatment and gave resources for mental health follow up if concerns arise.  CSW  recommends self-evaluation during the postpartum time period using the New Mom Checklist from Postpartum Progress and encouraged MOB to contact a medical professional if symptoms are noted at any time.   ° °CSW provided review of Sudden Infant Death Syndrome (SIDS) precautions.   ° °CSW will continue to offer resources/supports while infant is admitted to the NICU.  ° ° °CSW Plan/Description:  Sudden Infant Death Syndrome (SIDS) Education, Perinatal Mood and Anxiety Disorder (PMADs) Education, Psychosocial Support and Ongoing Assessment of Needs, Other Patient/Family Education, Other Information/Referral to Community Resources  ° ° °Bill Yohn L Tattianna Schnarr, LCSW °05/05/2021, 10:54 AM °

## 2021-05-05 NOTE — Progress Notes (Signed)
Subjective: Postpartum Day 2: Cesarean Delivery Patient reports incisional pain, tolerating PO, and no problems voiding.   Reports fatigue and feeling dizzy when ambulation. Bleeding is very light.   Objective: Vital signs in last 24 hours: Temp:  [98 F (36.7 C)-98.7 F (37.1 C)] 98.7 F (37.1 C) (01/03 1152) Pulse Rate:  [58-95] 95 (01/03 1152) Resp:  [16-18] 18 (01/03 1152) BP: (102-116)/(44-62) 104/50 (01/03 1152) SpO2:  [99 %-100 %] 99 % (01/03 1152)  Physical Exam:  General: alert, cooperative, and no distress Lochia: appropriate Uterine Fundus: firm Incision: healing well DVT Evaluation: No evidence of DVT seen on physical exam.  Recent Labs    05/03/21 0547 05/05/21 0452  HGB 10.4* 8.2*  HCT 31.4* 25.5*    Assessment/Plan: Status post Cesarean section. Postoperative course complicated by iron deficiency anemia.. plan for IV venofer today.    Encouraged ambulation. Pain is better controlled  Plan discharge home tomorrow .  Christophe Louis 05/05/2021, 12:42 PM

## 2021-05-05 NOTE — Lactation Note (Signed)
This note was copied from a baby's chart. Lactation Consultation Note  Patient Name: Gloria Estes Date: 05/05/2021 Reason for consult: NICU baby;Initial assessment;1st time breastfeeding;Weekly NICU follow-up;Late-preterm 34-36.6wks;Infant < 6lbs Age:24 hours  Visited with mom. Mom expressed some discomfort when she pumps but no pain, or discomfort at rest. Mom hasn't been pumping consistently and we expressed to mom the important of consistent pumping for the onset of lactogenesis II. Discussed baby feeding cues and mom has been taking the baby to the breast for IDF 1.  Plan of Care Expressed to mom to pump at least 8+/24hrs every 3hrs. Continue to take baby to breast for IDF 1.   Maternal Data Supply is WNL.   Feeding Mother's Current Feeding Choice: Breast Milk and Donor Milk  Lactation Tools Discussed/Used Tools: Pump;Coconut oil Breast pump type: Double-Electric Breast Pump Pump Education: Setup, frequency, and cleaning;Milk Storage Reason for Pumping: NICU Pumping frequency: 3times/24hrs Pumped volume: 15 mL  Interventions Interventions: Breast feeding basics reviewed;Education;DEBP;Coconut oil  Discharge Pump: DEBP;Personal;Stork Pump (Medela pump at home [facility] pump on 05/04/21))  Consult Status Consult Status: Follow-up Date: 05/05/21 Follow-up type: In-patient    Scot Jun 05/05/2021, 12:42 PM

## 2021-05-06 ENCOUNTER — Ambulatory Visit: Payer: Self-pay

## 2021-05-06 LAB — SURGICAL PATHOLOGY

## 2021-05-06 MED ORDER — OXYCODONE HCL 5 MG PO TABS: 5.0000 mg | ORAL_TABLET | ORAL | 0 refills | Status: DC | PRN

## 2021-05-06 MED ORDER — IBUPROFEN 600 MG PO TABS: 600.0000 mg | ORAL_TABLET | Freq: Four times a day (QID) | ORAL | 1 refills | Status: DC | PRN

## 2021-05-06 MED ORDER — ACETAMINOPHEN 500 MG PO TABS: 1000.0000 mg | ORAL_TABLET | Freq: Three times a day (TID) | ORAL | 0 refills | Status: AC | PRN

## 2021-05-06 NOTE — Progress Notes (Signed)
Per chart review, the last edinburgh score entered was a 13 with question 10 (the thought of harming myself has occurred to me) answered with a 1 (hardly ever). CSW followed up with MOB at bedside. MOB reported that she didn't recall filling out the edinburgh last night and that the answer to question 10 was incorrect. MOB denied any thoughts of self harm. CSW informed MOB that her edinburgh score was elevated and encouraged MOB to keep a close check on symptoms/signs of PPD, MOB verbalized understanding. CSW inquired about how MOB was feeling, MOB reported that she was feeling okay. CSW assessed for safety, MOB denied SI, HI, and domestic violence. CSW inquired about any needs/concerns, MOB reported none. CSW encouraged MOB to contact CSW if any needs/concerns arise.  ° °Gloria Broz, LCSW °Clinical Social Worker °[facility] °Cell#: [phone] °

## 2021-05-06 NOTE — Lactation Note (Signed)
This note was copied from a baby's chart. Lactation Consultation Note  Patient Name: Gloria Estes [id number] Date: 05/06/2021 Reason for consult: Maternal discharge;NICU baby;Follow-up assessment;Late-preterm 34-36.6wks;Mother's request;Infant < 6lbs Age:24 days  Visited with mom of 57 hours old LPI NICU female, she's a P2 and experienced BF. She was discharged today, reviewed discharge education, engorgement prevention/treatment and sore nipples.  She has been pumping consistently but skipped a couple of sessions today due to her discharge. Explained to mom the importance of consistent pumping for the onset of lactogenesis II and the prevention of engorgement; she voiced understanding.  Baby cueing during [facility] consultation, offered assistance with latch; took baby to mom's right breast she latched shallow. Mom's nipples are bigger than baby's mouth, baby can't latch deeply at this time; but she did some suckling at the breast, most of it was NNS, only one swallow noted when [facility] was doing light compressions.   Reviewed feeding cues, normal LPI behavior, IDF 1/2, pre-feeding opportunities and pumping schedule. LC also provided an additional set of flange sizes for mom to take home, her Medela Stork pump didn't come with size # 27.  Maternal Data  Mom's supply is coming into volume and is WNL  Feeding Mother's Current Feeding Choice: Breast Milk and Donor Milk  LATCH Score Latch: Repeated attempts needed to sustain latch, nipple held in mouth throughout feeding, stimulation needed to elicit sucking reflex.  Audible Swallowing: None  Type of Nipple: Everted at rest and after stimulation  Comfort (Breast/Nipple): Soft / non-tender  Hold (Positioning): Assistance needed to correctly position infant at breast and maintain latch.  LATCH Score: 6  Lactation Tools Discussed/Used Tools: Pump;Flanges;Coconut oil;Hands-free pumping top (pumping top size "S") Flange Size: 27;30 Breast pump type:  Double-Electric Breast Pump Pump Education: Setup, frequency, and cleaning;Milk Storage Reason for Pumping: LPI in NICU Pumping frequency: 6 times/24 hours Pumped volume: 60 mL  Interventions Interventions: Breast feeding basics reviewed;Breast massage;Hand express;Assisted with latch;Breast compression;Adjust position;Support pillows;DEBP;Education  Plan of care  Encouraged mom to continue pumping consistently every 3 hours, at least 8 pumping sessions/24 hours She'll continue taking baby to a pumped breast on feeding cues and will call for assistance PRN.  FOB present and supportive. All questions and concerns answered, family to call NICU LC PRN.  Discharge Discharge Education: Engorgement and breast care Pump: DEBP;Stork Pump (Stork pump on 05/04/21)  Consult Status Consult Status: Follow-up Date: 05/06/21 Follow-up type: In-patient   Elanda Garmany Francene Boyers 05/06/2021, 6:00 PM

## 2021-05-06 NOTE — Progress Notes (Signed)
Discharge instructions and prescriptions given to pt. Discussed post c-section care, signs and symptoms to report to the MD, upcoming appointments, and meds. Pt verbalizes understanding and has no questions or concerns at this time. Pt discharged home from hospital in stable condition. 

## 2021-05-06 NOTE — Progress Notes (Signed)
CSW received and acknowledges consult for EDPS of 12.  CSW seen MOB on 05/04/2021 and completed psychosocial assessment. CSW provided education regarding the baby blues period vs. perinatal mood disorders, discussed treatment and gave resources for mental health follow up if concerns arise.  CSW recommends self-evaluation during the postpartum time period using the New Mom Checklist from Postpartum Progress and encouraged MOB to contact a medical professional if symptoms are noted at any time.    CSW identifies no further need for intervention and no barriers to discharge at this time.   Celso Sickle, LCSW Clinical Social Worker [facility] Cell#: [phone]

## 2021-05-06 NOTE — Discharge Summary (Signed)
Postpartum Discharge Summary  Date of Service updated 05/06/2021     Patient Name: Gloria Estes DOB: 17-Jun-1995 MRN: [id number]  Date of admission: 04/29/2021 Delivery date:05/03/2021  Delivering provider: Christophe Louis  Date of discharge: 05/06/2021  Admitting diagnosis: Preterm premature rupture of membranes [O42.919] Intrauterine pregnancy: [redacted]w[redacted]d     Secondary diagnosis:  Principal Problem:   Preterm premature rupture of membranes  Additional problems: Breech presentation     Discharge diagnosis: Preterm Pregnancy Delivered and PPH                                              Post partum procedures: IV venofer  Augmentation: N/A Complications: None  Hospital course:  pt was admitted with PPROM and had a breech presentation. She received latency antibiotics and betamethasone for fetal lung maturity. She began having regular contractions on 05/02/2021 and fetal heart rate was nonreassuring at that time. She was taken for cesarean section due to labor breech presentation and nonreassuring heart rate.   Magnesium Sulfate received: No BMZ received: Yes Rhophylac:N/A MMR:N/A T-DaP:Given prenatally Flu: N/A Transfusion:No  Physical exam  Vitals:   05/05/21 2000 05/05/21 2356 05/06/21 0341 05/06/21 0748  BP: (!) 111/57 (!) 99/46 (!) 110/54 (!) 105/55  Pulse: (!) 106 99 88 (!) 102  Resp: $Remo'18 18 16 18  'SyxYZ$ Temp: 98.3 F (36.8 C) 97.9 F (36.6 C) 98.3 F (36.8 C) 98.2 F (36.8 C)  TempSrc: Oral Oral Oral Oral  SpO2: 99% 99% 100% 100%  Weight:      Height:       General: alert, cooperative, and no distress Lochia: appropriate Uterine Fundus: firm Incision: Healing well with no significant drainage DVT Evaluation: No evidence of DVT seen on physical exam. Labs: Lab Results  Component Value Date   WBC 9.2 05/05/2021   HGB 8.2 (L) 05/05/2021   HCT 25.5 (L) 05/05/2021   MCV 95.1 05/05/2021   PLT 187 05/05/2021   CMP Latest Ref Rng & Units 05/29/2020  Glucose 70 - 99  mg/dL 89  BUN 6 - 20 mg/dL 7  Creatinine 0.44 - 1.00 mg/dL 0.73  Sodium 135 - 145 mmol/L 136  Potassium 3.5 - 5.1 mmol/L 3.5  Chloride 98 - 111 mmol/L 103  CO2 22 - 32 mmol/L 24  Calcium 8.9 - 10.3 mg/dL 9.3  Total Protein 6.5 - 8.1 g/dL 7.2  Total Bilirubin 0.3 - 1.2 mg/dL 0.6  Alkaline Phos 38 - 126 U/L 47  AST 15 - 41 U/L 14(L)  ALT 0 - 44 U/L 12   Edinburgh Score: Edinburgh Postnatal Depression Scale Screening Tool 05/05/2021  I have been able to laugh and see the funny side of things. 0  I have looked forward with enjoyment to things. 1  I have blamed myself unnecessarily when things went wrong. 3  I have been anxious or worried for no good reason. 2  I have felt scared or panicky for no good reason. 2  Things have been getting on top of me. 1  I have been so unhappy that I have had difficulty sleeping. 1  I have felt sad or miserable. 1  I have been so unhappy that I have been crying. 1  The thought of harming myself has occurred to me. 1  Edinburgh Postnatal Depression Scale Total 13      After visit  meds:  Allergies as of 05/06/2021       Reactions   Tape Itching, Other (See Comments)        Medication List     STOP taking these medications    levocetirizine 5 MG tablet Commonly known as: XYZAL       TAKE these medications    acetaminophen 500 MG tablet Commonly known as: TYLENOL Take 2 tablets (1,000 mg total) by mouth every 8 (eight) hours as needed.   Flovent HFA 220 MCG/ACT inhaler Generic drug: fluticasone Inhale 2 puffs into the lungs 2 (two) times daily.   fluticasone 50 MCG/ACT nasal spray Commonly known as: FLONASE 1-2 spray in each nostril   ibuprofen 600 MG tablet Commonly known as: ADVIL Take 1 tablet (600 mg total) by mouth every 6 (six) hours as needed.   oxyCODONE 5 MG immediate release tablet Commonly known as: Oxy IR/ROXICODONE Take 1-2 tablets (5-10 mg total) by mouth every 4 (four) hours as needed for moderate pain.    Prenatal 28-0.8 MG Tabs Take 1 tablet by mouth daily.               Durable Medical Equipment  (From admission, onward)           Start     Ordered   05/03/21 1114  For home use only DME double electric breast pump  Once       Comments: Z39.1   05/03/21 1114             Discharge home in stable condition Infant Feeding: Breast Infant Disposition:NICU Discharge instruction: per After Visit Summary and Postpartum booklet. Activity: Advance as tolerated. Pelvic rest for 6 weeks.  Diet: routine diet Anticipated Birth Control: Unsure Postpartum Appointment:2 weeks Additional Postpartum F/U: Incision check 2 weeks  Future Appointments:No future appointments. Follow up Visit:  Follow-up Information     Christophe Louis, MD. Schedule an appointment as soon as possible for a visit in 2 week(s).   Specialty: Obstetrics and Gynecology Why: please make an appointment with Dr. Landry Mellow in 2 weeks for incision check and to evaluate for postpartum depression Contact information: [address] [place] [address] [phone]                     05/06/2021 Christophe Louis, MD

## 2021-05-07 ENCOUNTER — Telehealth: Payer: Self-pay

## 2021-05-07 NOTE — Telephone Encounter (Signed)
Transition Care Management Follow-up Telephone Call Date of discharge and from where: 05/06/2021-[facility] How have you been since you were released from the hospital? Patient stated she is doing fine. Any questions or concerns? No  Items Reviewed: Did the pt receive and understand the discharge instructions provided? Yes  Medications obtained and verified? Yes  Other? No  Any new allergies since your discharge? No  Dietary orders reviewed? No Do you have support at home? Yes   Home Care and Equipment/Supplies: Were home health services ordered? not applicable If so, what is the name of the agency? N/A  Has the agency set up a time to come to the patient's home? not applicable Were any new equipment or medical supplies ordered?  No What is the name of the medical supply agency? N/A Were you able to get the supplies/equipment? not applicable Do you have any questions related to the use of the equipment or supplies? No  Functional Questionnaire: (I = Independent and D = Dependent) ADLs: I  Bathing/Dressing- I  Meal Prep- I  Eating- I  Maintaining continence- I  Transferring/Ambulation- I  Managing Meds- I  Follow up appointments reviewed:  PCP Hospital f/u appt confirmed? No   Specialist Hospital f/u appt confirmed? No   Are transportation arrangements needed? No  If their condition worsens, is the pt aware to call PCP or go to the Emergency Dept.? Yes Was the patient provided with contact information for the PCP's office or ED? Yes Was to pt encouraged to call back with questions or concerns? Yes

## 2021-05-08 ENCOUNTER — Ambulatory Visit: Payer: Self-pay

## 2021-05-08 NOTE — Lactation Note (Signed)
This note was copied from a baby's chart.  NICU Lactation Consultation Note  Patient Name: Gloria Estes JSEGB'T Date: 05/08/2021 Age:24 days   Subjective Reason for consult: Follow-up assessment Mother is pumping but complains of discomfort while pumping. We reviewed flange size and I provided additional size.  Mother would like to challenge baby at breast. We reviewed pre-pumping and IDF readiness scoring.  Objective Infant data: Mother's Current Feeding Choice: Breast Milk  Infant feeding assessment Scale for Readiness: 2 Scale for Quality: 4   Maternal data: [id number]  C-Section, Low Transverse Pumping frequency: q3 Pumped volume: 180 mL Flange Size: 30  Pump: DEBP, Stork Pump (Stork pump on 05/04/21)  Assessment Infant: LATCH Score: 7  Maternal: Milk volume: Abundant   Intervention/Plan Interventions: Education  Tools: Flanges  Plan: Consult Status: Follow-up  NICU Follow-up type: Weekly NICU follow up; Assist with IDF-1 (Mother to pre-pump before breastfeeding)  Mother will try 26mm flanges. LC will plan f/u to assist with positioning/latch.  Elder Negus 05/08/2021, 4:11 PM

## 2021-05-09 ENCOUNTER — Ambulatory Visit: Payer: Self-pay

## 2021-05-09 NOTE — Lactation Note (Signed)
This note was copied from a baby's chart. Lactation Consultation Note  Patient Name: Girl Charise Leinbach ACZYS'A Date: 05/09/2021 Reason for consult: Follow-up assessment;1st time breastfeeding;Primapara;NICU baby;Infant < 6lbs;Late-preterm 34-36.6wks;Breastfeeding assistance;Mother's request Age:24 days  Visited with mom of 32 33/83 weeks old LPI NICU female, she's a P1 and requested a feeding assist. LC assisted with latch in cross cradle  hold, baby still uncoordinated and kept sucking on her fingers and fists when putting nipple near her mouth. Once mom did some hand expression and baby tasted colostrum, she latched on but kept coming off the breast on and off.   Mom able to re-latch baby each time, with LC help and also on her own. She sucked for 12 minutes which was mostly NNS, a few audible swallows noted upon light compressions during this feeding. No adverse events to report, baby self released at the 27 minutes mark. Reviewed IDF 1/2, pumping schedule and lactogenesis II/III. LC also provided bottles and labels per mom's request. Mom reports flanges # 30 are working out for her.  Maternal Data  Mom's supply is abundant and ANL  Feeding Mother's Current Feeding Choice: Breast Milk  LATCH Score Latch: Repeated attempts needed to sustain latch, nipple held in mouth throughout feeding, stimulation needed to elicit sucking reflex. (with teacup hold)  Audible Swallowing: A few with stimulation (with compressions)  Type of Nipple: Everted at rest and after stimulation  Comfort (Breast/Nipple): Soft / non-tender  Hold (Positioning): Assistance needed to correctly position infant at breast and maintain latch.  LATCH Score: 7  Lactation Tools Discussed/Used Tools: Pump;Flanges Flange Size: 30 Breast pump type: Double-Electric Breast Pump Pump Education: Setup, frequency, and cleaning;Milk Storage Reason for Pumping: LPI in NICU Pumping frequency: 8 times/24 hours Pumped volume: 100  mL  Plan of care   Encouraged mom to continue pumping consistently every 3 hours, at least 8 pumping sessions/24 hours She'll continue taking baby to a pumped breast on feeding cues and will call for assistance PRN   No other support person at this time. All questions and concerns answered, mom to call NICU LC PRN.   Interventions Interventions: Breast feeding basics reviewed;Assisted with latch;Breast massage;Hand express;Breast compression;Adjust position;Support pillows;DEBP;Education;Pre-pump if needed  Discharge Pump: DEBP;Stork Pump  Consult Status Consult Status: Follow-up Date: 05/09/21 Follow-up type: In-patient   Denna Fryberger Venetia Constable 05/09/2021, 5:45 PM

## 2021-05-12 DIAGNOSIS — F4323 Adjustment disorder with mixed anxiety and depressed mood: Secondary | ICD-10-CM | POA: Diagnosis not present

## 2021-05-13 ENCOUNTER — Ambulatory Visit: Payer: Self-pay

## 2021-05-13 NOTE — Lactation Note (Signed)
This note was copied from a baby's chart. Lactation Consultation Note  Patient Name: Gloria Estes ZYSAY'T Date: 05/13/2021 Reason for consult: Follow-up assessment;1st time breastfeeding;Primapara;NICU baby;Infant < 6lbs;Late-preterm 34-36.6wks;Breastfeeding assistance Age:24 days  Visited with mom of 70 41/79 weeks old NICU female, she requested latch assistance. Baby already nursing in cross cradle hold when entered the room, mom reported that she felt baby "tugging" at the breast, she woke up before her care times and she took her to breast with the help of her NICU RN.   NS pattern noted during the last 5 minutes of this feeding while LC was present in the room, baby self released after 12 minutes; mom reported she didn't pump prior this feeding and she felt that her right side (where baby fed) felt much looser. Reviewed IDF 1/2, pumping schedule and lactogenesis III; baby currently on gold nipple.  Maternal Data  Mom's supply is abundant and ANL  Feeding Mother's Current Feeding Choice: Breast Milk  LATCH Score Latch: Repeated attempts needed to sustain latch, nipple held in mouth throughout feeding, stimulation needed to elicit sucking reflex.  Audible Swallowing: A few with stimulation (with compressions)  Type of Nipple: Everted at rest and after stimulation  Comfort (Breast/Nipple): Soft / non-tender  Hold (Positioning): No assistance needed to correctly position infant at breast. (minimal assistance needed)  LATCH Score: 8  Lactation Tools Discussed/Used Tools: Pump;Flanges Flange Size: 30 Breast pump type: Double-Electric Breast Pump Pump Education: Setup, frequency, and cleaning;Milk Storage Reason for Pumping: LPI in NICU Pumping frequency: 8 times/24 hours Pumped volume: 180 mL  Interventions Interventions: Breast feeding basics reviewed;Infant Driven Feeding Algorithm education;Assisted with latch;Breast compression;Adjust position;Support  pillows;DEBP  Plan of care   Encouraged mom to continue pumping consistently every 3 hours, at least 8 pumping sessions/24 hours She'll continue taking baby to a semi-pumped breast on feeding cues and will call for assistance PRN   No other support person at this time. All questions and concerns answered, mom to call NICU LC PRN.   Discharge Pump: DEBP;Stork Pump  Consult Status Consult Status: Follow-up Date: 05/13/21 Follow-up type: In-patient   Gloria Estes 05/13/2021, 2:17 PM

## 2021-05-18 ENCOUNTER — Telehealth (HOSPITAL_COMMUNITY): Payer: Self-pay | Admitting: *Deleted

## 2021-05-18 NOTE — Telephone Encounter (Signed)
Patient voiced no questions or concerns at this time. EPDS=4. Infant is in NICU. Patient requested RN email information on hospital's virtual postpartum classes and support groups. Email sent. Erline Levine, RN, 05/18/21, [phone]

## 2021-05-19 ENCOUNTER — Ambulatory Visit: Payer: 59

## 2021-05-19 DIAGNOSIS — F4323 Adjustment disorder with mixed anxiety and depressed mood: Secondary | ICD-10-CM | POA: Diagnosis not present

## 2021-05-20 ENCOUNTER — Ambulatory Visit: Payer: Self-pay

## 2021-05-20 NOTE — Lactation Note (Signed)
This note was copied from a baby's chart.  NICU Lactation Consultation Note  Patient Name: Gloria Estes EHUDJ'S Date: 05/20/2021 Age:24 wk.o.   Subjective Reason for consult: Follow-up assessment Mother continues to pump often and with a large volume of milk. She continues to feel full between pumpings. Mother and infant are bf'ing independently when baby is awake and cuing. Mother is concerned about foremilk-hindmilk imbalance. I assured mother that infant is receiving both foremilk and hind milk, and that her volume will likely regulate to infant when she latching more consistently.   Mother requests an observed bf tomorrow.   Objective Infant data: Mother's Current Feeding Choice: Breast Milk  Infant feeding assessment Scale for Readiness: 1 Scale for Quality: 4   Maternal data: [id number]  C-Section, Low Transverse Pumping frequency: 7 x day Pumped volume: 240 mL   Pump: DEBP, Stork Pump  Assessment Infant: LATCH Score: 10  No data recorded  Maternal: Milk volume: Abundant   Intervention/Plan Interventions: Education; Infant Driven Feeding Algorithm education  Plan: Consult Status: Follow-up (1100 on 1/19)  NICU Follow-up type: Assist with IDF-2 (Mother does not need to pre-pump before breastfeeding)  LC to observe baby at breast tomorrow.   Elder Negus 05/20/2021, 5:04 PM

## 2021-05-21 ENCOUNTER — Ambulatory Visit: Payer: Self-pay

## 2021-05-21 NOTE — Lactation Note (Addendum)
This note was copied from a baby's chart. Lactation Consultation Note  Patient Name: Gloria Estes ZHYQM'V Date: 05/21/2021 Reason for consult: Follow-up assessment;Mother's request;NICU baby;Preterm <34wks Age:24 wk.o.  Lactation followed up to assist with breast feeding. Baby Evyn began to cue 30 minutes prior to scheduled feeding time. Gloria Estes placed her on the left breast in cradle hold. I showed her how to move her hand to cross cradle hold to better "sandwich" her tissue for more depth of latch.  Baby latched to the breast. We noted good jaw excursions and rhythmic suckling sequences. Baby is on IDF protocol. When he finished, Gloria Estes noted softened breast tissue, and baby appeared sleepy. Baby fed a full 15 minutes and did not require gavage feeding at this consult.   Gloria Estes asked some questions about the protocol for moving from IDF to ad lib. She states that she would like to exclusively breastfeed. I recommended a joint consult with SLP as there are some questions regarding bottle feeding. However, Gloria Estes's primary focus is breast feeding.   Maternal Data Has patient been taught Hand Expression?: Yes Does the patient have breastfeeding experience prior to this delivery?: Yes How long did the patient breastfeed?: 15 months  Feeding Mother's Current Feeding Choice: Breast Milk Nipple Type: Nfant Extra Slow Flow (gold)  LATCH Score Latch: Grasps breast easily, tongue down, lips flanged, rhythmical sucking.  Audible Swallowing: Spontaneous and intermittent  Type of Nipple: Everted at rest and after stimulation  Comfort (Breast/Nipple): Soft / non-tender  Hold (Positioning): No assistance needed to correctly position infant at breast.  LATCH Score: 10  Lactation Tools Discussed/Used Breast pump type: Double-Electric Breast Pump Pump Education: Setup, frequency, and cleaning Reason for Pumping: NICU Pumping frequency: rec q3 hours Pumped volume: 240 mL  (3-4 ounces/breast)  Interventions Interventions: Breast feeding basics reviewed;Skin to skin;Hand express;Support pillows;DEBP;Education  Discharge    Consult Status Consult Status: Follow-up Date: 05/21/21 Follow-up type: In-patient    Walker Shadow 05/21/2021, 11:03 AM

## 2021-05-22 ENCOUNTER — Ambulatory Visit: Payer: Self-pay

## 2021-05-22 NOTE — Lactation Note (Signed)
This note was copied from a baby's chart. Lactation Consultation Note  Patient Name: Gloria Estes XMIWO'E Date: 05/22/2021 Reason for consult: Follow-up assessment;NICU baby;Mother's request;Late-preterm 34-36.6wks;Infant < 6lbs Age:24 wk.o.  Visited with mom of 86 65/10 weeks old NICU female, she's been taking baby to breast on a regular basis since yesterday and her pumping have gone from 240 ml to 180 ml on average since she started taking baby to breast. Mom had a feeding assist at 11 am but by the time LC came in the room, baby was already done and asleep. Mom voiced she's been waking up 30 minutes or so before her feeding times and that she suckled at the breast for 19 minutes today.  SLP Byrd Hesselbach joined later and NICU RN Mordecai Maes also provided updates to mom regarding introducing two bottles to baby when she's not here (night time feedings) mom agreeable with plan, she voiced that she'll continue putting baby to breast during daytime because baby likes it and her goal is to exclusively BF.   Mom also reports other signs of transfer such as softening of the breast. Asked mom to call for assistance to observe the feeding and assess transfer, even if this is before baby's feeding time. She voiced some discomfort on her nipples since she started taking baby to breast, they looked intact upon examination. This LC offered to help mom with latching and positioning.  Baby hasn't had a gavage feeding since yesterday because she's been mainly going to breast for + 15 minutes at a time. No weight loss reported but baby hasn't gained weight either. Will continue to monitor.   Maternal Data  Mom's supply is abundant and ANL  Feeding Mother's Current Feeding Choice: Breast Milk  LATCH Score Latch: Grasps breast easily, tongue down, lips flanged, rhythmical sucking.  Audible Swallowing: Spontaneous and intermittent  Type of Nipple: Everted at rest and after stimulation  Comfort (Breast/Nipple):  Soft / non-tender  Hold (Positioning): No assistance needed to correctly position infant at breast.  LATCH Score: 10  Lactation Tools Discussed/Used Tools: Pump;Flanges Flange Size: 27 Breast pump type: Double-Electric Breast Pump Pump Education: Setup, frequency, and cleaning;Milk Storage Reason for Pumping: LPI in NICU Pumping frequency: 7-8 times/24 hours Pumped volume: 180 mL  Interventions Interventions: Breast feeding basics reviewed;Breast massage;Hand express;DEBP;Education  Plan of care  Encouraged mom to continue putting baby to breast at feeding times on feeding cues She'll continue pumping after feedings at the breast  No other support person at this point. All questions and concerns answered, mom to call NICU LC PRN.  Discharge Pump: DEBP;Stork Pump  Consult Status Consult Status: Follow-up Date: 05/22/21 Follow-up type: In-patient   Buryl Bamber Venetia Constable 05/22/2021, 11:49 AM

## 2021-06-09 DIAGNOSIS — F4323 Adjustment disorder with mixed anxiety and depressed mood: Secondary | ICD-10-CM | POA: Diagnosis not present

## 2021-06-17 DIAGNOSIS — F4323 Adjustment disorder with mixed anxiety and depressed mood: Secondary | ICD-10-CM | POA: Diagnosis not present

## 2021-06-23 DIAGNOSIS — F4323 Adjustment disorder with mixed anxiety and depressed mood: Secondary | ICD-10-CM | POA: Diagnosis not present

## 2021-06-30 DIAGNOSIS — F4323 Adjustment disorder with mixed anxiety and depressed mood: Secondary | ICD-10-CM | POA: Diagnosis not present

## 2021-07-08 DIAGNOSIS — F4323 Adjustment disorder with mixed anxiety and depressed mood: Secondary | ICD-10-CM | POA: Diagnosis not present

## 2021-07-14 DIAGNOSIS — F4323 Adjustment disorder with mixed anxiety and depressed mood: Secondary | ICD-10-CM | POA: Diagnosis not present

## 2021-07-21 DIAGNOSIS — F4323 Adjustment disorder with mixed anxiety and depressed mood: Secondary | ICD-10-CM | POA: Diagnosis not present

## 2021-07-28 DIAGNOSIS — F4323 Adjustment disorder with mixed anxiety and depressed mood: Secondary | ICD-10-CM | POA: Diagnosis not present

## 2021-07-31 ENCOUNTER — Other Ambulatory Visit (HOSPITAL_COMMUNITY)
Admission: RE | Admit: 2021-07-31 | Discharge: 2021-07-31 | Disposition: A | Discharge status: Home / Self Care | Payer: 59 | Source: Ambulatory Visit | Attending: Nurse Practitioner | Admitting: Nurse Practitioner

## 2021-07-31 ENCOUNTER — Ambulatory Visit (INDEPENDENT_AMBULATORY_CARE_PROVIDER_SITE_OTHER): Payer: 59 | Admitting: Nurse Practitioner

## 2021-07-31 ENCOUNTER — Encounter: Payer: Self-pay | Admitting: Nurse Practitioner

## 2021-07-31 VITALS — BP 129/79 | HR 88 | Temp 95.7°F | Wt 171.2 lb

## 2021-07-31 DIAGNOSIS — R300 Dysuria: Secondary | ICD-10-CM | POA: Insufficient documentation

## 2021-07-31 DIAGNOSIS — M6208 Separation of muscle (nontraumatic), other site: Secondary | ICD-10-CM

## 2021-07-31 LAB — POCT URINALYSIS DIPSTICK
Bilirubin, UA: NEGATIVE
Blood, UA: NEGATIVE
Glucose, UA: NEGATIVE
Ketones, UA: NEGATIVE
Leukocytes, UA: NEGATIVE
Nitrite, UA: NEGATIVE
Protein, UA: NEGATIVE
Spec Grav, UA: 1.025 (ref 1.010–1.025)
Urobilinogen, UA: 0.2 E.U./dL
pH, UA: 6 (ref 5.0–8.0)

## 2021-07-31 NOTE — Progress Notes (Signed)
? ?Acute Office Visit ? ?Subjective:  ? ? Patient ID: Gloria Estes, female    DOB: 09-30-1995, 26 y.o.   MRN: [id number] ? ?Chief Complaint  ?Patient presents with  ? Urinary Tract Infection  ?  Pt c/o pain after urinating x2-3 wks, no burning, no unusual discharge  ? ? ?HPI ?Patient is in today for burning after urinating for the past 2 weeks. The symptoms have been stable over the 2 weeks. Denies, fevers, vaginal discharge, itching, nausea and vomiting. Endorses some suprapubic pain and back pain but this is not abnormal. She had a c-section on May 03, 2021. She was on antibiotics in January with the birth of her daughter. Denies concern for STDs. She has not tried any treatment at home.  ? ?She has also been noticing a bulge in her stomach when she exercises. She denies any pain. When she followed up with her OB for her 6 week follow-up after c-section, she was told that if it doesn't go away she may need physical therapy.  ? ?History reviewed. No pertinent past medical history. ? ?Past Surgical History:  ?Procedure Laterality Date  ? CESAREAN SECTION N/A 05/03/2021  ? Procedure: CESAREAN SECTION;  Surgeon: Christophe Louis, MD;  Location: [facility] LD ORS;  Service: Obstetrics;  Laterality: N/A;  Primary C/S PPROM Breech  ? DILATION AND CURETTAGE OF UTERUS    ? ? ?Family History  ?Problem Relation Age of Onset  ? Healthy Mother   ? Healthy Father   ? ? ?Social History  ? ?Socioeconomic History  ? Marital status: Single  ?  Spouse name: Not on file  ? Number of children: Not on file  ? Years of education: Not on file  ? Highest education level: Not on file  ?Occupational History  ? Not on file  ?Tobacco Use  ? Smoking status: Never  ? Smokeless tobacco: Never  ?Vaping Use  ? Vaping Use: Never used  ?Substance and Sexual Activity  ? Alcohol use: Not Currently  ?  Comment: social  ? Drug use: Never  ? Sexual activity: Yes  ?  Birth control/protection: None  ?Other Topics Concern  ? Not on file  ?Social History Narrative  ?  Not on file  ? ?Social Determinants of Health  ? ?Financial Resource Strain: Not on file  ?Food Insecurity: Not on file  ?Transportation Needs: Not on file  ?Physical Activity: Not on file  ?Stress: Not on file  ?Social Connections: Not on file  ?Intimate Partner Violence: Not on file  ? ? ?Outpatient Medications Prior to Visit  ?Medication Sig Dispense Refill  ? acetaminophen (TYLENOL) 500 MG tablet Take 2 tablets (1,000 mg total) by mouth every 8 (eight) hours as needed. 30 tablet 0  ? FLOVENT HFA 220 MCG/ACT inhaler Inhale 2 puffs into the lungs 2 (two) times daily.    ? fluticasone (FLONASE) 50 MCG/ACT nasal spray 1-2 spray in each nostril (Patient not taking: Reported on 01/19/2021)    ? ibuprofen (ADVIL) 600 MG tablet Take 1 tablet (600 mg total) by mouth every 6 (six) hours as needed. 30 tablet 1  ? oxyCODONE (OXY IR/ROXICODONE) 5 MG immediate release tablet Take 1-2 tablets (5-10 mg total) by mouth every 4 (four) hours as needed for moderate pain. 20 tablet 0  ? Prenatal 28-0.8 MG TABS Take 1 tablet by mouth daily.    ? ?No facility-administered medications prior to visit.  ? ? ?Allergies  ?Allergen Reactions  ? Tape Itching and Other (  See Comments)  ? ? ?Review of Systems ?See pertinent positives and negatives per HPI. ?   ?Objective:  ?  ?Physical Exam ?Vitals and nursing note reviewed.  ?Constitutional:   ?   General: She is not in acute distress. ?   Appearance: Normal appearance.  ?HENT:  ?   Head: Normocephalic.  ?Eyes:  ?   Conjunctiva/sclera: Conjunctivae normal.  ?Cardiovascular:  ?   Rate and Rhythm: Normal rate and regular rhythm.  ?   Pulses: Normal pulses.  ?   Heart sounds: Normal heart sounds.  ?Pulmonary:  ?   Effort: Pulmonary effort is normal.  ?   Breath sounds: Normal breath sounds.  ?Abdominal:  ?   Palpations: Abdomen is soft.  ?   Tenderness: There is no abdominal tenderness. There is no right CVA tenderness or left CVA tenderness.  ?   Comments: Bulging in mid abdomen noted with  tightening abdominal muscles  ?Musculoskeletal:  ?   Cervical back: Normal range of motion.  ?Skin: ?   General: Skin is warm.  ?Neurological:  ?   General: No focal deficit present.  ?   Mental Status: She is alert and oriented to person, place, and time.  ?Psychiatric:     ?   Mood and Affect: Mood normal.     ?   Behavior: Behavior normal.     ?   Thought Content: Thought content normal.     ?   Judgment: Judgment normal.  ? ? ?BP 129/79 (BP Location: Left Arm, Patient Position: Sitting, Cuff Size: Normal)   Pulse 88   Temp (!) 95.7 ?F (35.4 ?C) (Temporal)   Wt 171 lb 3.2 oz (77.7 kg)   SpO2 100%   BMI 25.28 kg/m?  ?Wt Readings from Last 3 Encounters:  ?07/31/21 171 lb 3.2 oz (77.7 kg)  ?04/29/21 160 lb (72.6 kg)  ?02/28/21 150 lb (68 kg)  ? ? ?Health Maintenance Due  ?Topic Date Due  ? HIV Screening  Never done  ? Hepatitis C Screening  Never done  ? PAP-Cervical Cytology Screening  Never done  ? PAP SMEAR-Modifier  Never done  ? INFLUENZA VACCINE  12/01/2020  ? ? ?There are no preventive care reminders to display for this patient. ? ? ?No results found for: TSH ?Lab Results  ?Component Value Date  ? WBC 9.2 05/05/2021  ? HGB 8.2 (L) 05/05/2021  ? HCT 25.5 (L) 05/05/2021  ? MCV 95.1 05/05/2021  ? PLT 187 05/05/2021  ? ?Lab Results  ?Component Value Date  ? NA 136 05/29/2020  ? K 3.5 05/29/2020  ? CO2 24 05/29/2020  ? GLUCOSE 89 05/29/2020  ? BUN 7 05/29/2020  ? CREATININE 0.73 05/29/2020  ? BILITOT 0.6 05/29/2020  ? ALKPHOS 47 05/29/2020  ? AST 14 (L) 05/29/2020  ? ALT 12 05/29/2020  ? PROT 7.2 05/29/2020  ? ALBUMIN 3.8 05/29/2020  ? CALCIUM 9.3 05/29/2020  ? ANIONGAP 9 05/29/2020  ? ?No results found for: CHOL ?No results found for: HDL ?No results found for: Aragon ?No results found for: TRIG ?No results found for: CHOLHDL ?No results found for: HGBA1C ? ?   ?Assessment & Plan:  ? ?Problem List Items Addressed This Visit   ?None ?Visit Diagnoses   ? ? Dysuria    -  Primary  ? U/A negative. Will check  urine culture and for BV, yeast. Encouraged her to drink plenty of fluids. Will treat based on results.  ? Relevant Orders  ?  POCT urinalysis dipstick (Completed)  ? Urine Culture  ? Urine cytology ancillary only  ? Diastasis of rectus abdominis      ? Discsused doing abdominal exercises. Will refer to PT as well. Follow up with any concerns or ongoing symptoms.   ? Relevant Orders  ? Ambulatory referral to Physical Therapy  ? ?  ? ?No orders of the defined types were placed in this encounter. ? ?Charyl Dancer, NP ? ?

## 2021-07-31 NOTE — Patient Instructions (Addendum)
It was great to see you! ? ?We are sending your urine for culture. Drink a lot of water in the meantime.  ? ?You can do planks, side planks, sit-ups and oblique sit ups to help with your stomach. I am also going to place a referral for physical therapy.  ? ?Let's follow-up if your symptoms worsen or don't improve.  ? ?Take care, ? ?Rodman Pickle, NP ? ?

## 2021-08-04 LAB — URINE CYTOLOGY ANCILLARY ONLY
Bacterial Vaginitis-Urine: NEGATIVE
Candida Urine: NEGATIVE
Comment: NEGATIVE
Trichomonas: NEGATIVE

## 2021-08-11 DIAGNOSIS — F4323 Adjustment disorder with mixed anxiety and depressed mood: Secondary | ICD-10-CM | POA: Diagnosis not present

## 2021-08-18 DIAGNOSIS — F4323 Adjustment disorder with mixed anxiety and depressed mood: Secondary | ICD-10-CM | POA: Diagnosis not present

## 2021-08-21 ENCOUNTER — Ambulatory Visit: Payer: 59 | Admitting: Nurse Practitioner

## 2021-08-25 DIAGNOSIS — F4323 Adjustment disorder with mixed anxiety and depressed mood: Secondary | ICD-10-CM | POA: Diagnosis not present

## 2021-09-01 DIAGNOSIS — F4323 Adjustment disorder with mixed anxiety and depressed mood: Secondary | ICD-10-CM | POA: Diagnosis not present

## 2021-09-08 DIAGNOSIS — F4323 Adjustment disorder with mixed anxiety and depressed mood: Secondary | ICD-10-CM | POA: Diagnosis not present

## 2021-09-15 DIAGNOSIS — F4323 Adjustment disorder with mixed anxiety and depressed mood: Secondary | ICD-10-CM | POA: Diagnosis not present

## 2021-09-21 DIAGNOSIS — F4323 Adjustment disorder with mixed anxiety and depressed mood: Secondary | ICD-10-CM | POA: Diagnosis not present

## 2021-09-29 DIAGNOSIS — F4323 Adjustment disorder with mixed anxiety and depressed mood: Secondary | ICD-10-CM | POA: Diagnosis not present

## 2021-10-07 DIAGNOSIS — F4323 Adjustment disorder with mixed anxiety and depressed mood: Secondary | ICD-10-CM | POA: Diagnosis not present

## 2021-10-13 DIAGNOSIS — F4323 Adjustment disorder with mixed anxiety and depressed mood: Secondary | ICD-10-CM | POA: Diagnosis not present

## 2021-10-20 DIAGNOSIS — F4323 Adjustment disorder with mixed anxiety and depressed mood: Secondary | ICD-10-CM | POA: Diagnosis not present

## 2021-10-27 DIAGNOSIS — F4323 Adjustment disorder with mixed anxiety and depressed mood: Secondary | ICD-10-CM | POA: Diagnosis not present

## 2021-11-04 DIAGNOSIS — F4323 Adjustment disorder with mixed anxiety and depressed mood: Secondary | ICD-10-CM | POA: Diagnosis not present

## 2021-11-17 DIAGNOSIS — F4323 Adjustment disorder with mixed anxiety and depressed mood: Secondary | ICD-10-CM | POA: Diagnosis not present

## 2021-11-23 DIAGNOSIS — F4323 Adjustment disorder with mixed anxiety and depressed mood: Secondary | ICD-10-CM | POA: Diagnosis not present

## 2021-12-08 DIAGNOSIS — F4323 Adjustment disorder with mixed anxiety and depressed mood: Secondary | ICD-10-CM | POA: Diagnosis not present

## 2021-12-15 DIAGNOSIS — F4323 Adjustment disorder with mixed anxiety and depressed mood: Secondary | ICD-10-CM | POA: Diagnosis not present

## 2021-12-22 DIAGNOSIS — F4323 Adjustment disorder with mixed anxiety and depressed mood: Secondary | ICD-10-CM | POA: Diagnosis not present

## 2022-01-05 DIAGNOSIS — F4323 Adjustment disorder with mixed anxiety and depressed mood: Secondary | ICD-10-CM | POA: Diagnosis not present

## 2022-01-12 DIAGNOSIS — F4323 Adjustment disorder with mixed anxiety and depressed mood: Secondary | ICD-10-CM | POA: Diagnosis not present

## 2022-01-19 DIAGNOSIS — F4323 Adjustment disorder with mixed anxiety and depressed mood: Secondary | ICD-10-CM | POA: Diagnosis not present

## 2022-01-28 DIAGNOSIS — F4323 Adjustment disorder with mixed anxiety and depressed mood: Secondary | ICD-10-CM | POA: Diagnosis not present

## 2022-02-04 DIAGNOSIS — F4323 Adjustment disorder with mixed anxiety and depressed mood: Secondary | ICD-10-CM | POA: Diagnosis not present

## 2022-02-08 IMAGING — US US MFM FETAL BPP W/O NON-STRESS
1 series · 12 of 28 positions shown · non-contrast
Comparison: none

[Series 1: us mfm fetal bpp w/o non-stress · 29 acquisitions, 12 frames shown]
[im 2/29]
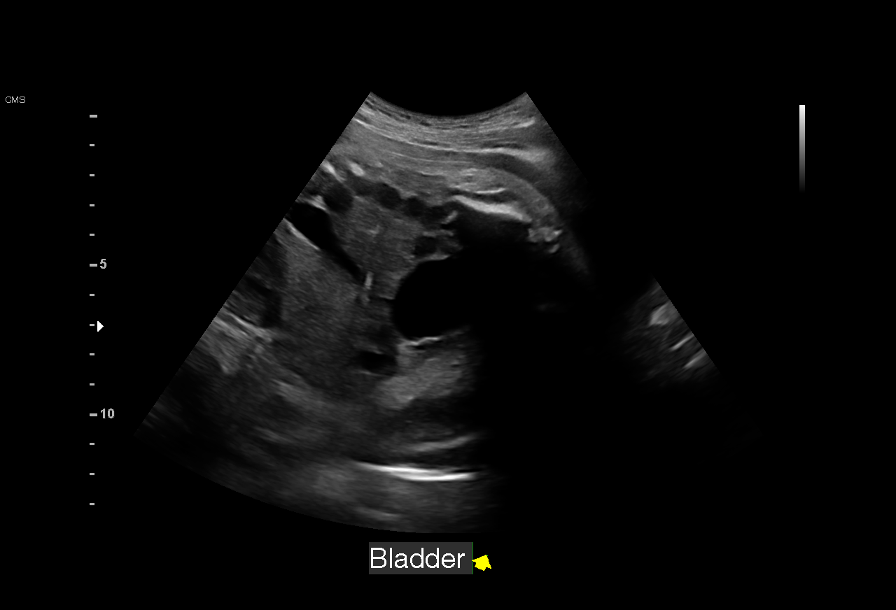
[im 4/29]
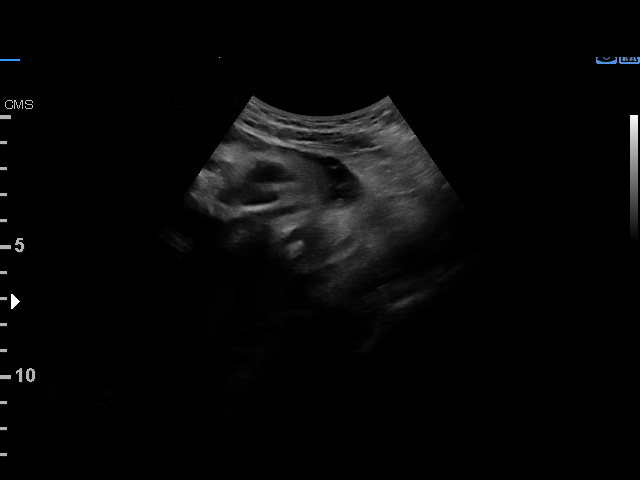
[im 6/29]
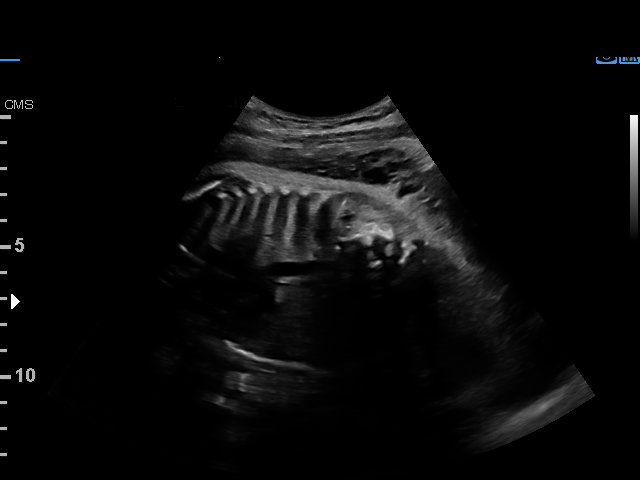
[im 9/29]
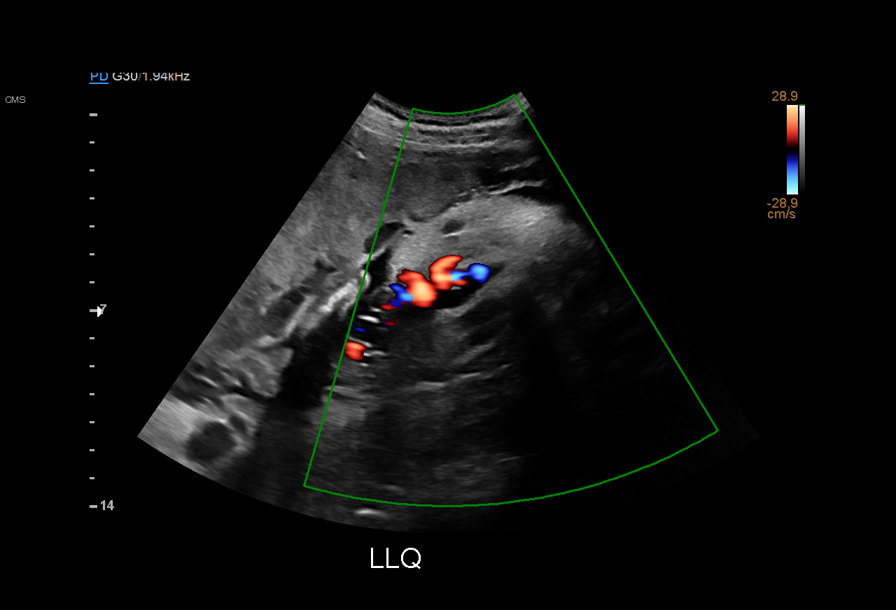
[im 11/29]
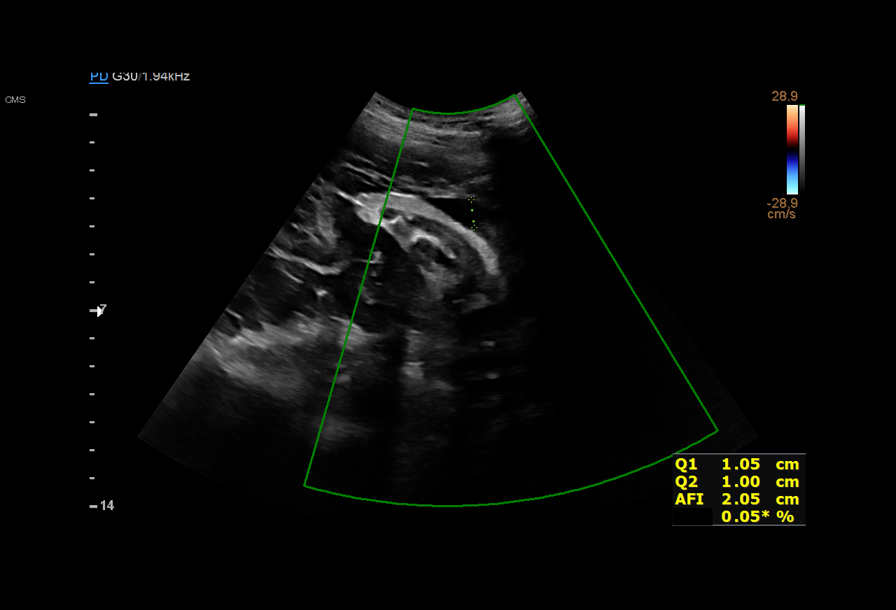
[im 13/29]
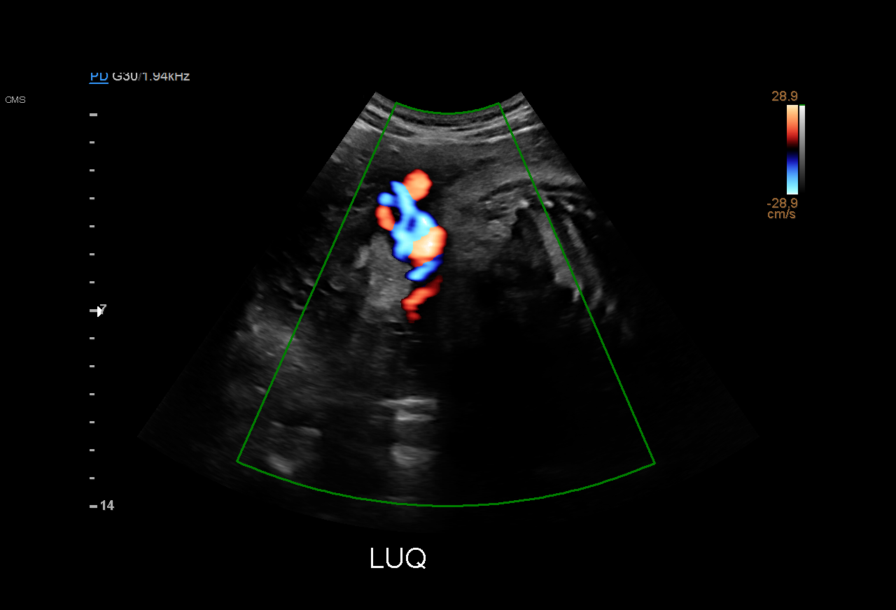
[im 16/29]
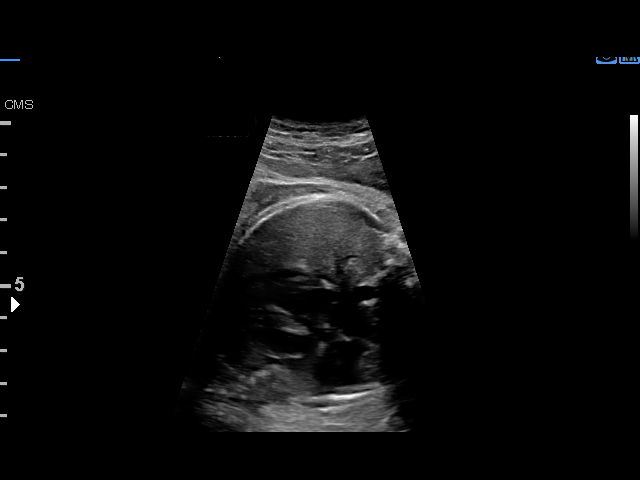
[im 18/29]
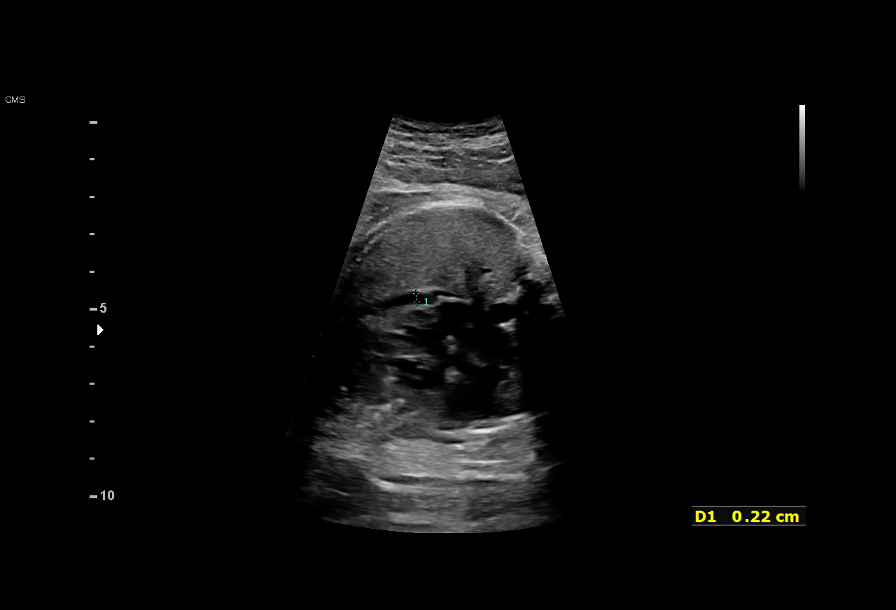
[im 20/29]
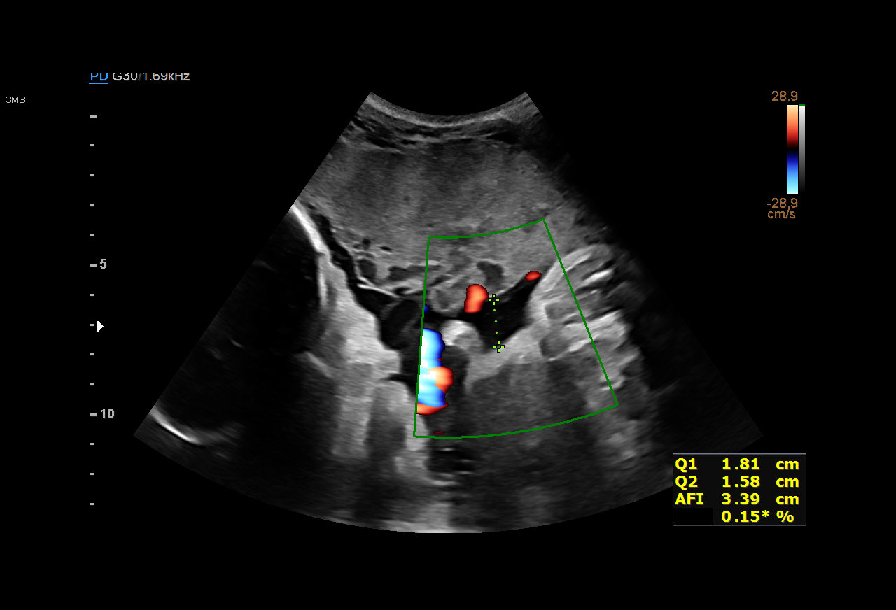
[im 23/29]
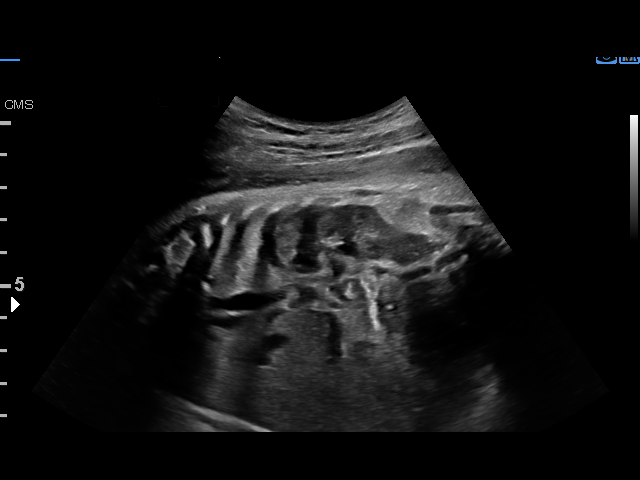
[im 25/29]
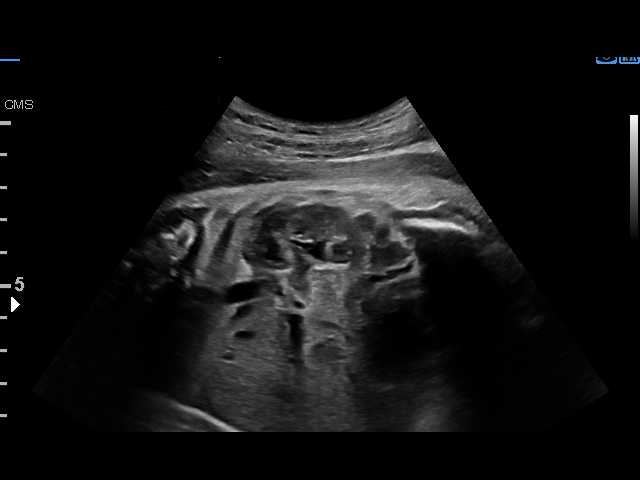
[im 27/29]
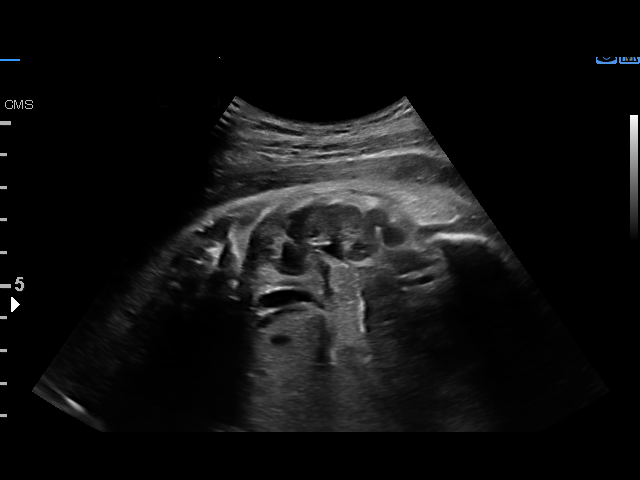

[12 of 28 positions shown; findings below may reference images not displayed]

[REDACTED]
                                                            Ave[address] [HOSPITAL]

Indications

 Premature rupture of membranes - leaking
 fluid
 History of fetal abnormality in previous
 pregnancy, currently pregnant (Tetralogy of
 Jibo
 33 weeks gestation of pregnancy
 Fetal pericardial effusion
 Subchorionic hemorrhage, antepartum
 (resolved)
 Encounter for other antenatal screening
 follow-up
 Abnormal fetal ultrasound ([DATE] BPP this AM)
Fetal Evaluation

 Num Of Fetuses:         1
 Fetal Heart Rate(bpm):  136
 Cardiac Activity:       Observed
 Presentation:           Breech
 Placenta:               Anterior

 Amniotic Fluid
 AFI FV:      Oligohydramnios

 AFI Sum(cm)     %Tile       Largest Pocket(cm)
 3.4             < 3

 RUQ(cm)                     LUQ(cm)        LLQ(cm)

Biophysical Evaluation

 Amniotic F.V:   Pocket < 2 cm two          F. Tone:        Observed
                 planes
 F. Movement:    Observed                   Score:          [DATE]
 F. Breathing:   Observed
OB History

 Gravidity:    4
 Living:       1
Gestational Age

 LMP:           33w 3d        Date:  09/08/20                 EDD:   06/15/21
 Best:          33w 3d     Det. By:  LMP  (09/08/20)          EDD:   06/15/21
Comments

 This patient had a repeat BPP performed this afternoon after
 a total biophysical profile score [DATE] this morning.
 Her NST has been reactive throughout the day without any
 decelerations noted.
 A biophysical profile performed this afternoon was [DATE].
 The patient received a -2,  as a greater than 2 cm pocket of
 amniotic fluid was not present.  This is to be expected as she
 has ruptured membranes.
 As the fetal status is now reassuring, she should continue
 daily NSTs while she is hospitalized.
 I will make delivery management recommendations when I
 see the patient tomorrow morning.

## 2022-02-11 DIAGNOSIS — F4323 Adjustment disorder with mixed anxiety and depressed mood: Secondary | ICD-10-CM | POA: Diagnosis not present

## 2022-03-11 DIAGNOSIS — F4323 Adjustment disorder with mixed anxiety and depressed mood: Secondary | ICD-10-CM | POA: Diagnosis not present

## 2022-03-18 DIAGNOSIS — F4323 Adjustment disorder with mixed anxiety and depressed mood: Secondary | ICD-10-CM | POA: Diagnosis not present

## 2022-03-23 DIAGNOSIS — F4323 Adjustment disorder with mixed anxiety and depressed mood: Secondary | ICD-10-CM | POA: Diagnosis not present

## 2022-04-01 DIAGNOSIS — F4323 Adjustment disorder with mixed anxiety and depressed mood: Secondary | ICD-10-CM | POA: Diagnosis not present

## 2022-04-08 DIAGNOSIS — F4323 Adjustment disorder with mixed anxiety and depressed mood: Secondary | ICD-10-CM | POA: Diagnosis not present

## 2022-04-15 DIAGNOSIS — F4323 Adjustment disorder with mixed anxiety and depressed mood: Secondary | ICD-10-CM | POA: Diagnosis not present

## 2022-04-22 DIAGNOSIS — F4323 Adjustment disorder with mixed anxiety and depressed mood: Secondary | ICD-10-CM | POA: Diagnosis not present

## 2022-07-06 ENCOUNTER — Telehealth: Payer: 59 | Admitting: Nurse Practitioner

## 2022-07-06 DIAGNOSIS — H1031 Unspecified acute conjunctivitis, right eye: Secondary | ICD-10-CM

## 2022-07-06 MED ORDER — POLYMYXIN B-TRIMETHOPRIM 10000-0.1 UNIT/ML-% OP SOLN: 1.0000 [drp] | Freq: Four times a day (QID) | OPHTHALMIC | 0 refills | Status: AC

## 2022-07-06 NOTE — Progress Notes (Signed)
Virtual Visit Consent   Gloria Estes, you are scheduled for a virtual visit with a [facility] provider today. Just as with appointments in the office, your consent must be obtained to participate. Your consent will be active for this visit and any virtual visit you may have with one of our providers in the next 365 days. If you have a MyChart account, a copy of this consent can be sent to you electronically.  As this is a virtual visit, video technology does not allow for your provider to perform a traditional examination. This may limit your provider's ability to fully assess your condition. If your provider identifies any concerns that need to be evaluated in person or the need to arrange testing (such as labs, EKG, etc.), we will make arrangements to do so. Although advances in technology are sophisticated, we cannot ensure that it will always work on either your end or our end. If the connection with a video visit is poor, the visit may have to be switched to a telephone visit. With either a video or telephone visit, we are not always able to ensure that we have a secure connection.  By engaging in this virtual visit, you consent to the provision of healthcare and authorize for your insurance to be billed (if applicable) for the services provided during this visit. Depending on your insurance coverage, you may receive a charge related to this service.  I need to obtain your verbal consent now. Are you willing to proceed with your visit today? Thelia DOROTHIA BINA has provided verbal consent on 07/06/2022 for a virtual visit (video or telephone). Apolonio Schneiders, FNP  Date: 07/06/2022 7:38 AM  Virtual Visit via Video Note   I, Apolonio Schneiders, connected with  Gloria Estes  ([id number], [birth year]) on 07/06/22 at  7:45 AM EST by a video-enabled telemedicine application and verified that I am speaking with the correct person using two identifiers.  Location: Patient: Virtual Visit Location Patient:  Home Provider: Virtual Visit Location Provider: Home Office   I discussed the limitations of evaluation and management by telemedicine and the availability of in person appointments. The patient expressed understanding and agreed to proceed.    History of Present Illness: Gloria Estes is a 27 y.o. who identifies as a female who was assigned female at birth, and is being seen today for red and irritated right eye. 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    Allergies:  Allergies  Allergen Reactions   Tape Itching and Other (See Comments)   Medications:  Current Outpatient Medications:    acetaminophen (TYLENOL) 500 MG tablet, Take 2 tablets (1,000 mg total) by mouth every 8 (eight) hours as needed., Disp: 30 tablet, Rfl: 0   FLOVENT HFA 220 MCG/ACT inhaler, Inhale 2 puffs into the lungs 2 (two) times daily., Disp: , Rfl:    fluticasone (FLONASE) 50 MCG/ACT nasal spray, 1-2 spray  in each nostril (Patient not taking: Reported on 01/19/2021), Disp: , Rfl:    ibuprofen (ADVIL) 600 MG tablet, Take 1 tablet (600 mg total) by mouth every 6 (six) hours as needed., Disp: 30 tablet, Rfl: 1   oxyCODONE (OXY IR/ROXICODONE) 5 MG immediate release tablet, Take 1-2 tablets (5-10 mg total) by mouth every 4 (four) hours as needed for moderate pain., Disp: 20 tablet, Rfl: 0   Prenatal 28-0.8 MG TABS, Take 1 tablet by mouth daily., Disp: , Rfl:    Observations/Objective: Patient is well-developed, well-nourished in no acute distress.  Resting comfortably  at home.  Head is normocephalic, atraumatic.  No labored breathing.  Speech is clear and coherent with logical content.  Patient is alert and oriented at baseline.  Red sclera erythematous   Assessment and Plan: 1. Acute bacterial conjunctivitis of right eye Discussed contagious period, changing contacts after 24 hours on drops.  Best to wear glasses while using drops and until symptoms resolve  Also be sure to change contact case as bacteria will grow in contact cases as well   - trimethoprim-polymyxin b (POLYTRIM) ophthalmic solution; Place 1 drop into the right eye 4 (four) times daily for 5 days.  Dispense: 10 mL; Refill: 0     Follow Up Instructions: I discussed the assessment and treatment plan with the patient. The patient was provided an opportunity to ask questions and all were answered. The patient agreed with the plan and demonstrated an understanding of the instructions.  A copy of instructions were sent to the patient via MyChart unless otherwise noted below.    The patient was advised to call back or seek an in-person evaluation if the symptoms worsen or if the condition fails to improve as anticipated.  Time:  I spent 15 minutes with the patient via telehealth technology discussing the above problems/concerns.    Apolonio Schneiders, FNP

## 2022-07-14 ENCOUNTER — Telehealth: Payer: 59 | Admitting: Physician Assistant

## 2022-07-14 DIAGNOSIS — J020 Streptococcal pharyngitis: Secondary | ICD-10-CM

## 2022-07-14 MED ORDER — AMOXICILLIN 500 MG PO CAPS: 500.0000 mg | ORAL_CAPSULE | Freq: Two times a day (BID) | ORAL | 0 refills | Status: AC

## 2022-07-14 NOTE — Patient Instructions (Signed)
Gloria Estes, thank you for joining Mar Daring, PA-C for today's virtual visit.  While this provider is not your primary care provider (PCP), if your PCP is located in our provider database this encounter information will be shared with them immediately following your visit.   [facility] account gives you access to today's visit and all your visits, tests, and labs performed at [facility] " click here if you don't have a [facility] account or go to [email or web address]  Consent: (Patient) Gloria Estes provided verbal consent for this virtual visit at the beginning of the encounter.  Current Medications:  Current Outpatient Medications:    amoxicillin (AMOXIL) 500 MG capsule, Take 1 capsule (500 mg total) by mouth 2 (two) times daily for 10 days., Disp: 20 capsule, Rfl: 0   acetaminophen (TYLENOL) 500 MG tablet, Take 2 tablets (1,000 mg total) by mouth every 8 (eight) hours as needed., Disp: 30 tablet, Rfl: 0   FLOVENT HFA 220 MCG/ACT inhaler, Inhale 2 puffs into the lungs 2 (two) times daily., Disp: , Rfl:    fluticasone (FLONASE) 50 MCG/ACT nasal spray, 1-2 spray in each nostril (Patient not taking: Reported on 01/19/2021), Disp: , Rfl:    ibuprofen (ADVIL) 600 MG tablet, Take 1 tablet (600 mg total) by mouth every 6 (six) hours as needed., Disp: 30 tablet, Rfl: 1   oxyCODONE (OXY IR/ROXICODONE) 5 MG immediate release tablet, Take 1-2 tablets (5-10 mg total) by mouth every 4 (four) hours as needed for moderate pain., Disp: 20 tablet, Rfl: 0   Prenatal 28-0.8 MG TABS, Take 1 tablet by mouth daily., Disp: , Rfl:    Medications ordered in this encounter:  Meds ordered this encounter  Medications   amoxicillin (AMOXIL) 500 MG capsule    Sig: Take 1 capsule (500 mg total) by mouth 2 (two) times daily for 10 days.    Dispense:  20 capsule    Refill:  0    Order Specific Question:   Supervising Provider    Answer:   Chase Picket [id number]      *If you need refills on other medications prior to your next appointment, please contact your pharmacy*  Follow-Up: Call back or seek an in-person evaluation if the symptoms worsen or if the condition fails to improve as anticipated.  [place] [phone]  Other Instructions  Strep Throat, Adult Strep throat is an infection in the throat that is caused by bacteria. It is common during the cold months of the year. It mostly affects children who are 19-52 years old. However, people of all ages can get it at any time of the year. This infection spreads from person to person (is contagious) through coughing, sneezing, or having close contact. Your health care provider may use other names to describe the infection. When strep throat affects the tonsils, it is called tonsillitis. When it affects the back of the throat, it is called pharyngitis. What are the causes? This condition is caused by the Streptococcus pyogenes bacteria. What increases the risk? You are more likely to develop this condition if: You care for school-age children, or are around school-age children. Children are more likely to get strep throat and may spread it to others. You spend time in crowded places where the infection can spread easily. You have close contact with someone who has strep throat. What are the signs or symptoms? Symptoms of this condition include: Fever or chills. Redness, swelling, or  pain in the tonsils or throat. Pain or difficulty when swallowing. White or yellow spots on the tonsils or throat. Tender glands in the neck and under the jaw. Bad smelling breath. Red rash all over the body. This is rare. How is this diagnosed? This condition is diagnosed by tests that check for the presence and the amount of bacteria that cause strep throat. They are: Rapid strep test. Your throat is swabbed and checked for the presence of bacteria. Results are usually ready in minutes. Throat  culture test. Your throat is swabbed. The sample is placed in a cup that allows infections to grow. Results are usually ready in 1 or 2 days. How is this treated? This condition may be treated with: Medicines that kill germs (antibiotics). Medicines that relieve pain or fever. These include: Ibuprofen or acetaminophen. Aspirin, only for people who are over the age of 13. Throat lozenges. Throat sprays. Follow these instructions at home: Medicines  Take over-the-counter and prescription medicines only as told by your health care provider. Take your antibiotic medicine as told by your health care provider. Do not stop taking the antibiotic even if you start to feel better. Eating and drinking  If you have trouble swallowing, try eating soft foods until your sore throat feels better. Drink enough fluid to keep your urine pale yellow. To help relieve pain, you may have: Warm fluids, such as soup and tea. Cold fluids, such as frozen desserts or popsicles. General instructions Gargle with a salt-water mixture 3-4 times a day or as needed. To make a salt-water mixture, completely dissolve -1 tsp (3-6 g) of salt in 1 cup (237 mL) of warm water. Get plenty of rest. Stay home from work or school until you have been taking antibiotics for 24 hours. Do not use any products that contain nicotine or tobacco. These products include cigarettes, chewing tobacco, and vaping devices, such as e-cigarettes. If you need help quitting, ask your health care provider. It is up to you to get your test results. Ask your health care provider, or the department that is doing the test, when your results will be ready. Keep all follow-up visits. This is important. How is this prevented?  Do not share food, drinking cups, or personal items that could cause the infection to spread to other people. Wash your hands often with soap and water for at least 20 seconds. If soap and water are not available, use hand  sanitizer. Make sure that all people in your house wash their hands well. Have family members tested if they have a sore throat or fever. They may need an antibiotic if they have strep throat. Contact a health care provider if: You have swelling in your neck that keeps getting bigger. You develop a rash, cough, or earache. You cough up a thick mucus that is green, yellow-brown, or bloody. You have pain or discomfort that does not get better with medicine. Your symptoms seem to be getting worse. You have a fever. Get help right away if: You have new symptoms, such as vomiting, severe headache, stiff or painful neck, chest pain, or shortness of breath. You have severe throat pain, drooling, or changes in your voice. You have swelling of the neck, or the skin on the neck becomes red and tender. You have signs of dehydration, such as tiredness (fatigue), dry mouth, and decreased urination. You become increasingly sleepy, or you cannot wake up completely. Your joints become red or painful. These symptoms may represent a serious  problem that is an emergency. Do not wait to see if the symptoms will go away. Get medical help right away. Call your local emergency services (911 in the [place] Do not drive yourself to the hospital. Summary Strep throat is an infection in the throat that is caused by the Streptococcus pyogenes bacteria. This infection is spread from person to person (is contagious) through coughing, sneezing, or having close contact. Take your medicines, including antibiotics, as told by your health care provider. Do not stop taking the antibiotic even if you start to feel better. To prevent the spread of germs, wash your hands well with soap and water. Have others do the same. Do not share food, drinking cups, or personal items. Get help right away if you have new symptoms, such as vomiting, severe headache, stiff or painful neck, chest pain, or shortness of breath. This information is not  intended to replace advice given to you by your health care provider. Make sure you discuss any questions you have with your health care provider. Document Revised: 08/12/2020 Document Reviewed: 08/12/2020 Elsevier Patient Education  [place].    If you have been instructed to have an in-person evaluation today at a local Urgent Care facility, please use the link below. It will take you to a list of all of our available [facility], including address, phone number and hours of operation. Please do not delay care.  [facility]  If you or a family member do not have a primary care provider, use the link below to schedule a visit and establish care. When you choose a [facility] primary care physician or advanced practice provider, you gain a long-term partner in health. Find a Primary Care Provider  Learn more about [facility] in-office and virtual care options: [facility]

## 2022-07-14 NOTE — Progress Notes (Signed)
Virtual Visit Consent   Gloria Estes, you are scheduled for a virtual visit with a [facility] provider today. Just as with appointments in the office, your consent must be obtained to participate. Your consent will be active for this visit and any virtual visit you may have with one of our providers in the next 365 days. If you have a MyChart account, a copy of this consent can be sent to you electronically.  As this is a virtual visit, video technology does not allow for your provider to perform a traditional examination. This may limit your provider's ability to fully assess your condition. If your provider identifies any concerns that need to be evaluated in person or the need to arrange testing (such as labs, EKG, etc.), we will make arrangements to do so. Although advances in technology are sophisticated, we cannot ensure that it will always work on either your end or our end. If the connection with a video visit is poor, the visit may have to be switched to a telephone visit. With either a video or telephone visit, we are not always able to ensure that we have a secure connection.  By engaging in this virtual visit, you consent to the provision of healthcare and authorize for your insurance to be billed (if applicable) for the services provided during this visit. Depending on your insurance coverage, you may receive a charge related to this service.  I need to obtain your verbal consent now. Are you willing to proceed with your visit today? Laruen JOSI HUMISTON has provided verbal consent on 07/14/2022 for a virtual visit (video or telephone). Mar Daring, PA-C  Date: 07/14/2022 7:53 AM  Virtual Visit via Video Note   I, Mar Daring, connected with  Gloria Estes  ([id number], May 19, 1995) on 07/14/22 at  7:45 AM EDT by a video-enabled telemedicine application and verified that I am speaking with the correct person using two identifiers.  Location: Patient: Virtual Visit Location  Patient: Mobile Provider: Virtual Visit Location Provider: Home Office   I discussed the limitations of evaluation and management by telemedicine and the availability of in person appointments. The patient expressed understanding and agreed to proceed.    History of Present Illness: Gloria Estes is a 27 y.o. who identifies as a female who was assigned female at birth, and is being seen today for sore throat.  HPI: Sore Throat  This is a new problem. The current episode started in the past 7 days. The problem has been gradually worsening. There has been no fever. Associated symptoms include congestion, ear pain, headaches, a plugged ear sensation, swollen glands and trouble swallowing. Pertinent negatives include no coughing, diarrhea, ear discharge or vomiting. She has had exposure to strep. Exposure to: 4 cases of strep at sons daycare. She has tried acetaminophen and NSAIDs for the symptoms. The treatment provided no relief.     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    Allergies:  Allergies  Allergen Reactions   Tape Itching and Other (See Comments)   Medications:  Current Outpatient Medications:    amoxicillin (AMOXIL) 500 MG capsule, Take 1 capsule (500 mg total) by mouth 2 (two) times daily for 10  days., Disp: 20 capsule, Rfl: 0   acetaminophen (TYLENOL) 500 MG tablet, Take 2 tablets (1,000 mg total) by mouth every 8 (eight) hours as needed., Disp: 30 tablet, Rfl: 0   FLOVENT HFA 220 MCG/ACT inhaler, Inhale 2 puffs into the lungs 2 (two) times daily., Disp: , Rfl:    fluticasone (FLONASE) 50 MCG/ACT nasal spray, 1-2 spray in each nostril (Patient not taking: Reported  on 01/19/2021), Disp: , Rfl:    ibuprofen (ADVIL) 600 MG tablet, Take 1 tablet (600 mg total) by mouth every 6 (six) hours as needed., Disp: 30 tablet, Rfl: 1   oxyCODONE (OXY IR/ROXICODONE) 5 MG immediate release tablet, Take 1-2 tablets (5-10 mg total) by mouth every 4 (four) hours as needed for moderate pain., Disp: 20 tablet, Rfl: 0   Prenatal 28-0.8 MG TABS, Take 1 tablet by mouth daily., Disp: , Rfl:   Observations/Objective: Patient is well-developed, well-nourished in no acute distress.  Resting comfortably Head is normocephalic, atraumatic.  No labored breathing.  Speech is clear and coherent with logical content.  Patient is alert and oriented at baseline.    Assessment and Plan: 1. Strep pharyngitis - amoxicillin (AMOXIL) 500 MG capsule; Take 1 capsule (500 mg total) by mouth 2 (two) times daily for 10 days.  Dispense: 20 capsule; Refill: 0  - Suspect strep throat - Amoxicillin prescribed - Tylenol and Ibuprofen alternating every 4 hours - Salt water gargles - Chloraseptic spray - Liquid and soft food diet - Push fluids - New toothbrush in 3 days - Seek in person evaluation if not improving or if symptoms worsen   Follow Up Instructions: I discussed the assessment and treatment plan with the patient. The patient was provided an opportunity to ask questions and all were answered. The patient agreed with the plan and demonstrated an understanding of the instructions.  A copy of instructions were sent to the patient via MyChart unless otherwise noted below.    The patient was advised to call back or seek an in-person evaluation if the symptoms worsen or if the condition fails to improve as anticipated.  Time:  I spent 10 minutes with the patient via telehealth technology discussing the above problems/concerns.    Mar Daring, PA-C

## 2022-08-16 DIAGNOSIS — F4323 Adjustment disorder with mixed anxiety and depressed mood: Secondary | ICD-10-CM | POA: Diagnosis not present

## 2022-08-18 ENCOUNTER — Encounter (HOSPITAL_BASED_OUTPATIENT_CLINIC_OR_DEPARTMENT_OTHER): Payer: Self-pay | Admitting: Pediatrics

## 2022-08-18 ENCOUNTER — Other Ambulatory Visit: Payer: Self-pay

## 2022-08-18 ENCOUNTER — Emergency Department (HOSPITAL_BASED_OUTPATIENT_CLINIC_OR_DEPARTMENT_OTHER)
Admission: EM | Admit: 2022-08-18 | Discharge: 2022-08-18 | Disposition: A | Discharge status: Home / Self Care | Payer: 59 | Attending: Emergency Medicine | Admitting: Emergency Medicine

## 2022-08-18 DIAGNOSIS — M542 Cervicalgia: Secondary | ICD-10-CM | POA: Insufficient documentation

## 2022-08-18 DIAGNOSIS — R519 Headache, unspecified: Secondary | ICD-10-CM | POA: Insufficient documentation

## 2022-08-18 DIAGNOSIS — R202 Paresthesia of skin: Secondary | ICD-10-CM | POA: Diagnosis present

## 2022-08-18 DIAGNOSIS — M791 Myalgia, unspecified site: Secondary | ICD-10-CM | POA: Insufficient documentation

## 2022-08-18 DIAGNOSIS — Y92410 Unspecified street and highway as the place of occurrence of the external cause: Secondary | ICD-10-CM | POA: Insufficient documentation

## 2022-08-18 MED ORDER — CYCLOBENZAPRINE HCL 5 MG PO TABS
5.0000 mg | ORAL_TABLET | Freq: Once | ORAL | Status: AC
  Administered 2022-08-18: 5 mg via ORAL
  Filled 2022-08-18: qty 1

## 2022-08-18 MED ORDER — IBUPROFEN 800 MG PO TABS
800.0000 mg | ORAL_TABLET | Freq: Once | ORAL | Status: AC
Start: 2022-08-18 — End: 2022-08-18
  Administered 2022-08-18: 800 mg via ORAL
  Filled 2022-08-18: qty 1

## 2022-08-18 MED ORDER — CYCLOBENZAPRINE HCL 10 MG PO TABS: 10.0000 mg | ORAL_TABLET | Freq: Two times a day (BID) | ORAL | 0 refills | Status: DC | PRN

## 2022-08-18 MED ORDER — OXYCODONE-ACETAMINOPHEN 5-325 MG PO TABS
1.0000 | ORAL_TABLET | Freq: Once | ORAL | Status: AC
  Administered 2022-08-18: 1 via ORAL
  Filled 2022-08-18: qty 1
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
## 2022-08-18 NOTE — Discharge Instructions (Addendum)
Please use ibuprofen, drink plenty of fluids, and use Flexeril for muscle aches. On your exam you did not appear to have any internal injuries or obvious injuries to the musculoskeletal system. As discussed, you are likely to have some ongoing muscle aches.  These should be treated with fluids, ibuprofen, and Flexeril as we discussed. If you are having new or worsening symptoms, please be reevaluated.
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
## 2022-08-18 NOTE — ED Notes (Signed)
Discharge instructions reviewed with patient. Patient verbalizes understanding, no further questions at this time. Medications/prescriptions and follow up information provided. No acute distress noted at time of departure.  

## 2022-08-19 ENCOUNTER — Telehealth: Payer: 59 | Admitting: Physician Assistant

## 2022-08-19 ENCOUNTER — Ambulatory Visit (INDEPENDENT_AMBULATORY_CARE_PROVIDER_SITE_OTHER): Payer: 59

## 2022-08-19 ENCOUNTER — Inpatient Hospital Stay: Admission: RE | Admit: 2022-08-19 | Payer: Self-pay | Source: Ambulatory Visit

## 2022-08-19 ENCOUNTER — Ambulatory Visit
Admission: EM | Admit: 2022-08-19 | Discharge: 2022-08-19 | Disposition: A | Discharge status: Home / Self Care | Payer: 59 | Attending: Family Medicine | Admitting: Family Medicine

## 2022-08-19 DIAGNOSIS — T887XXA Unspecified adverse effect of drug or medicament, initial encounter: Secondary | ICD-10-CM

## 2022-08-19 DIAGNOSIS — R519 Headache, unspecified: Secondary | ICD-10-CM

## 2022-08-19 DIAGNOSIS — M79601 Pain in right arm: Secondary | ICD-10-CM

## 2022-08-19 DIAGNOSIS — M25562 Pain in left knee: Secondary | ICD-10-CM

## 2022-08-19 DIAGNOSIS — G44209 Tension-type headache, unspecified, not intractable: Secondary | ICD-10-CM

## 2022-08-19 DIAGNOSIS — M25521 Pain in right elbow: Secondary | ICD-10-CM

## 2022-08-19 MED ORDER — KETOROLAC TROMETHAMINE 10 MG PO TABS: 10.0000 mg | ORAL_TABLET | Freq: Four times a day (QID) | ORAL | 0 refills | Status: DC | PRN

## 2022-08-19 MED ORDER — KETOROLAC TROMETHAMINE 30 MG/ML IJ SOLN
30.0000 mg | Freq: Once | INTRAMUSCULAR | Status: AC
  Administered 2022-08-19: 30 mg via INTRAMUSCULAR

## 2022-08-19 NOTE — Discharge Instructions (Signed)
You have been given a shot of Toradol 30 mg today.  Ketorolac 10 mg tablets--take 1 tablet every 6 hours as needed for pain.  This is the same medicine that is in the shot we just gave you   
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
## 2022-08-19 NOTE — Patient Instructions (Signed)
  Baldwin [place], thank you for joining Margaretann Loveless, PA-C for today's virtual visit.  While this provider is not your primary care provider (PCP), if your PCP is located in our provider database this encounter information will be shared with them immediately following your visit.   A [facility] MyChart account gives you access to today's visit and all your visits, tests, and labs performed at [facility] " click here if you don't have a [facility] MyChart account or go to [email or web address]  Consent: (Patient) Baldwin [place] provided verbal consent for this virtual visit at the beginning of the encounter.  Current Medications:  Current Outpatient Medications:    acetaminophen (TYLENOL) 500 MG tablet, Take 2 tablets (1,000 mg total) by mouth every 8 (eight) hours as needed., Disp: 30 tablet, Rfl: 0   cyclobenzaprine (FLEXERIL) 10 MG tablet, Take 1 tablet (10 mg total) by mouth 2 (two) times daily as needed for muscle spasms., Disp: 20 tablet, Rfl: 0   FLOVENT HFA 220 MCG/ACT inhaler, Inhale 2 puffs into the lungs 2 (two) times daily., Disp: , Rfl:    fluticasone (FLONASE) 50 MCG/ACT nasal spray, 1-2 spray in each nostril (Patient not taking: Reported on 01/19/2021), Disp: , Rfl:    ibuprofen (ADVIL) 600 MG tablet, Take 1 tablet (600 mg total) by mouth every 6 (six) hours as needed., Disp: 30 tablet, Rfl: 1   oxyCODONE (OXY IR/ROXICODONE) 5 MG immediate release tablet, Take 1-2 tablets (5-10 mg total) by mouth every 4 (four) hours as needed for moderate pain., Disp: 20 tablet, Rfl: 0   Prenatal 28-0.8 MG TABS, Take 1 tablet by mouth daily., Disp: , Rfl:    Medications ordered in this encounter:  No orders of the defined types were placed in this encounter.    *If you need refills on other medications prior to your next appointment, please contact your pharmacy*  Follow-Up: Call back or seek an in-person evaluation if the symptoms worsen or if the condition fails to  improve as anticipated.  [facility] [phone]   If you have been instructed to have an in-person evaluation today at a local Urgent Care facility, please use the link below. It will take you to a list of all of our available [facility], including address, phone number and hours of operation. Please do not delay care.  [facility]  If you or a family member do not have a primary care provider, use the link below to schedule a visit and establish care. When you choose a [facility] primary care physician or advanced practice provider, you gain a long-term partner in health. Find a Primary Care Provider  Learn more about [facility] in-office and virtual care options: [facility] Get Care Now
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
## 2022-08-23 DIAGNOSIS — F4323 Adjustment disorder with mixed anxiety and depressed mood: Secondary | ICD-10-CM | POA: Diagnosis not present

## 2022-08-30 DIAGNOSIS — F4323 Adjustment disorder with mixed anxiety and depressed mood: Secondary | ICD-10-CM | POA: Diagnosis not present

## 2022-09-06 ENCOUNTER — Other Ambulatory Visit: Payer: Self-pay

## 2022-09-06 ENCOUNTER — Ambulatory Visit
Admission: RE | Admit: 2022-09-06 | Discharge: 2022-09-06 | Disposition: A | Discharge status: Home / Self Care | Payer: 59 | Source: Ambulatory Visit | Attending: Internal Medicine | Admitting: Internal Medicine

## 2022-09-06 VITALS — BP 104/72 | HR 86 | Temp 98.2°F | Resp 18

## 2022-09-06 DIAGNOSIS — R0789 Other chest pain: Secondary | ICD-10-CM | POA: Diagnosis not present

## 2022-09-06 DIAGNOSIS — R051 Acute cough: Secondary | ICD-10-CM

## 2022-09-06 DIAGNOSIS — H1013 Acute atopic conjunctivitis, bilateral: Secondary | ICD-10-CM | POA: Diagnosis not present

## 2022-09-06 DIAGNOSIS — J301 Allergic rhinitis due to pollen: Secondary | ICD-10-CM | POA: Diagnosis not present

## 2022-09-06 MED ORDER — GUAIFENESIN ER 600 MG PO TB12: 1200.0000 mg | ORAL_TABLET | Freq: Two times a day (BID) | ORAL | 0 refills | Status: DC

## 2022-09-06 MED ORDER — BENZONATATE 100 MG PO CAPS: 100.0000 mg | ORAL_CAPSULE | Freq: Three times a day (TID) | ORAL | 0 refills | Status: DC

## 2022-09-06 MED ORDER — OLOPATADINE HCL 0.1 % OP SOLN: 1.0000 [drp] | Freq: Two times a day (BID) | OPHTHALMIC | 12 refills | Status: DC

## 2022-09-06 MED ORDER — FLUTICASONE PROPIONATE 50 MCG/ACT NA SUSP: 1.0000 | Freq: Every day | NASAL | 2 refills | Status: AC

## 2022-09-06 NOTE — ED Triage Notes (Addendum)
Pt is here with chest pain that started yesterday, coughing started 2 weeks ago states its allergies. Pt has taken OTC meds to relieve discomfort.

## 2022-09-06 NOTE — ED Provider Notes (Signed)
[name]    CSN: [id number] Arrival date & time: 09/06/22  0955      History   Chief Complaint Chief Complaint  Patient presents with   Wheezing    Chest pain - Entered by patient    HPI Gloria Estes is a 27 y.o. female.   Patient presents to urgent care for evaluation of dry cough for the last 2 weeks and central sharp stabbing chest discomfort that started yesterday.  She has a history of asthma as a child that she states was exercise-induced but has not needed an inhaler in many years.  Reports rhinorrhea, nasal congestion, sore throat, voice hoarseness, watery/itchy eyes, and sneezing for the last couple of weeks as well. She states she "tested negative for seasonal allergies" years ago and denies recent exposure to known allergens. Chest discomfort is described as sharp, stabbing, and achy.  No rash to the overlying skin reported.  Pain is intermittent and present at rest/with movement without known trigger or aggravating factors. No relieving factors identified for chest pain. She believes she may have heard some wheezing to her chest but she is not sure. Denies acid reflux symptoms, abdominal pain, N/V/D, fever/chills, and body aches. She is not a smoker and denies drug use. She has been taking ibuprofen, tylenol, zyrtec, and benadryl for symptoms without much relief. States she feels like there is something "inside her chest that needs to come out".    Wheezing   History reviewed. No pertinent past medical history.  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 Authorizing Provider  acetaminophen (TYLENOL) 500 MG tablet Take 2 tablets (1,000 mg total) by mouth every 8 (eight) hours as needed. 05/06/21   Gerald Leitz, MD  benzonatate (TESSALON) 100 MG capsule Take 1 capsule (100 mg total) by mouth every 8 (eight) hours. 09/06/22  Yes Carlisle Beers, FNP  cyclobenzaprine (FLEXERIL) 10 MG tablet Take 1 tablet (10 mg total) by mouth 2 (two) times daily as needed for muscle spasms. 08/18/22   Margarita Grizzle, MD  FLOVENT HFA 220 MCG/ACT inhaler Inhale 2 puffs into the lungs 2 (two) times daily. 11/10/20   [provider]  fluticasone (FLONASE) 50 MCG/ACT nasal spray Place 1 spray into both nostrils daily. 09/06/22  Yes Carlisle Beers, FNP  guaiFENesin (MUCINEX) 600 MG 12 hr tablet Take 2 tablets (1,200  mg total) by mouth 2 (two) times daily. 09/06/22  Yes Carlisle Beers, FNP  ketorolac (TORADOL) 10 MG tablet Take 1 tablet (10 mg total) by mouth every 6 (six) hours as needed (pain). 08/19/22   Zenia Resides, MD  olopatadine (PATANOL) 0.1 % ophthalmic solution Place 1 drop into both eyes 2 (two) times daily. 09/06/22  Yes Carlisle Beers, FNP  Prenatal 28-0.8 MG TABS Take 1 tablet by mouth daily.    [provider]  albuterol (PROVENTIL HFA;VENTOLIN HFA) 108 (90 Base) MCG/ACT inhaler Inhale 1 puff into the lungs every 6 (six) hours as needed for wheezing or shortness of breath.  03/20/19  [provider]  ferrous fumarate (HEMOCYTE - 106 MG FE) 325 (106 FE) MG TABS tablet  Take 1 tablet by mouth daily.   03/20/19  [provider]  medroxyPROGESTERone (DEPO-PROVERA) 150 MG/ML injection Inject 150 mg into the muscle every 3 (three) months. 08/02/19 01/30/20  [provider]    Family History Family History  Problem Relation Age of Onset   Healthy Mother    Healthy Father     Social History Social History   Tobacco Use   Smoking status: Never   Smokeless tobacco: Never  Vaping Use   Vaping Use: Never used  Substance Use Topics   Alcohol use: Not Currently    Comment: social   Drug use: Never     Allergies   Tape   Review of Systems Review of Systems  Respiratory:  Positive for wheezing.   Per HPI   Physical Exam Triage Vital Signs ED Triage Vitals  Enc Vitals Group     BP 09/06/22 1004 104/72     Pulse Rate 09/06/22 1004 86     Resp 09/06/22 1004 18     Temp 09/06/22 1004 98.2 F (36.8 C)     Temp Source 09/06/22 1004 Oral     SpO2 09/06/22 1004 96 %     Weight --      Height --      Head Circumference --      Peak Flow --      Pain Score 09/06/22 1003 0     Pain Loc --      Pain Edu? --      Excl. in GC? --    No data found.  Updated Vital Signs BP 104/72 (BP Location: Left Arm)   Pulse 86   Temp 98.2 F (36.8 C) (Oral)   Resp 18   LMP 08/04/2022 (Approximate)   SpO2 96%   Visual Acuity Right Eye Distance:   Left Eye Distance:   Bilateral Distance:    Right Eye Near:   Left Eye Near:    Bilateral Near:     Physical Exam Vitals and nursing note reviewed.  Constitutional:      Appearance: She is not ill-appearing or toxic-appearing.  HENT:     Head: Normocephalic and atraumatic.     Right Ear: Hearing, tympanic membrane, ear canal and external ear normal.     Left Ear: Hearing, tympanic membrane, ear canal and external ear normal.     Nose: Rhinorrhea present.     Mouth/Throat:     Lips: Pink.     Mouth: Mucous membranes are moist.     Pharynx: No posterior oropharyngeal erythema.   Eyes:     General: Lids are normal. Vision grossly intact. Gaze aligned appropriately.     Extraocular Movements: Extraocular movements intact.  Conjunctiva/sclera: Conjunctivae normal.  Cardiovascular:     Rate and Rhythm: Normal rate.  Pulmonary:     Effort: Pulmonary effort is normal. No respiratory distress.     Breath sounds: Normal breath sounds. No stridor. No wheezing, rhonchi or rales.  Chest:     Chest wall: Tenderness (Tender to palpation of the central chest wall) present.  Abdominal:     General: Abdomen is flat. Bowel sounds are normal.     Palpations: Abdomen is soft.     Tenderness: There is no abdominal tenderness. There is no guarding.  Musculoskeletal:     Cervical back: Neck supple.  Lymphadenopathy:     Cervical: No cervical adenopathy.  Skin:    General: Skin is warm and dry.     Capillary Refill: Capillary refill takes less than 2 seconds.     Findings: No rash.  Neurological:     General: No focal deficit present.     Mental Status: She is alert and oriented to person, place, and time. Mental status is at baseline.     Cranial Nerves: No dysarthria or facial asymmetry.  Psychiatric:        Mood and Affect: Mood normal.        Speech: Speech normal.        Behavior: Behavior normal.        Thought Content: Thought content normal.        Judgment: Judgment normal.      UC Treatments / Results  Labs (all labs ordered are listed, but only abnormal results are displayed) Labs Reviewed - No data to display  EKG   Radiology No results found.  Procedures Procedures (including critical care time)  Medications Ordered in UC Medications - No data to display  Initial Impression / Assessment and Plan / UC Course  I have reviewed the triage vital signs and the nursing notes.  Pertinent labs & imaging results that were available during my care of the patient were reviewed by me and considered in my medical decision making (see chart for  details).   1. Seasonal allergic rhinitis due to pollen, allergic conjunctivitis, acute cough, chest wall pain Symptoms and physical exam consistent with allergic rhinitis etiology. Doubt acute viral URI cause of symptoms. No indication for imaging at this time based on stable cardiopulmonary exam and hemodynamically stable vital signs. Advised to avoid known allergens and begin using daily antihistamine cetirizine 10mg  to dry up post-nasal drainage that is likely causing cough. Tessalon pearles may be used as needed for cough every 8 hours. Flonase daily for nasal inflammation and rhinorrhea. Olopatadine eye drops for symptomatic relief related to suspected allergic conjunctivitis/watery itchy eyes. May use guaifenesin to clear up chest congestion. Suspect chest discomfort is related to coughing and is musculoskeletal in nature. Guaifenesin will help to expectorate mucous.  She is nontoxic in appearance, is low risk for ACS, and chest pain is reproducible to palpation, therefore deferred EKG. Patient agreeable with plan. She requests prescriptions for over the counter medications, prescriptions sent.  Discussed physical exam and available lab work findings in clinic with patient.  Counseled patient regarding appropriate use of medications and potential side effects for all medications recommended or prescribed today. Discussed red flag signs and symptoms of worsening condition,when to call the PCP office, return to urgent care, and when to seek higher level of care in the emergency department. Patient verbalizes understanding and agreement with plan. All questions answered. Patient discharged in stable condition.  Final Clinical Impressions(s) / UC Diagnoses   Final diagnoses:  Seasonal allergic rhinitis due to pollen  Allergic conjunctivitis of both eyes  Acute cough  Chest wall pain     Discharge Instructions      Your symptoms are likely due to environmental allergies.  Avoid exposure to  allergens. Take oral antihistamine (Either Zyrtec, Claritin, or Allegra) and use Flonase daily as directed. You can buy these medications over the counter. These medications can take a few days to fully kick in to your body and start working. Return for any new or worsening symptoms.  If your eyes become itchy, you may purchase olopatadine (Pataday) eyedrops over the counter and use as directed to relieve watery/itchy eyes associated with allergies as well.   Tessalon perles every 8 hours as needed for cough.   Mucinex every 12 hours as needed for cough and congestion.   If your symptoms are severe, please go to the emergency room for further evaluation. Schedule an appointment with your primary care provider for follow-up and further management of your seasonal allergies as well as ongoing preventive healthcare. I hope you feel better!     ED Prescriptions     Medication Sig Dispense Auth. Provider   fluticasone (FLONASE) 50 MCG/ACT nasal spray Place 1 spray into both nostrils daily. 11.1 mL Reita May M, FNP   benzonatate (TESSALON) 100 MG capsule Take 1 capsule (100 mg total) by mouth every 8 (eight) hours. 21 capsule Reita May M, FNP   olopatadine (PATANOL) 0.1 % ophthalmic solution Place 1 drop into both eyes 2 (two) times daily. 5 mL Reita May M, FNP   guaiFENesin (MUCINEX) 600 MG 12 hr tablet Take 2 tablets (1,200 mg total) by mouth 2 (two) times daily. 30 tablet Carlisle Beers, FNP      PDMP not reviewed this encounter.   Carlisle Beers, FNP 09/06/22 1100

## 2022-09-06 NOTE — Discharge Instructions (Addendum)
Your symptoms are likely due to environmental allergies.  Avoid exposure to allergens. Take oral antihistamine (Either Zyrtec, Claritin, or Allegra) and use Flonase daily as directed. You can buy these medications over the counter. These medications can take a few days to fully kick in to your body and start working. Return for any new or worsening symptoms.  If your eyes become itchy, you may purchase olopatadine (Pataday) eyedrops over the counter and use as directed to relieve watery/itchy eyes associated with allergies as well.   Tessalon perles every 8 hours as needed for cough.   Mucinex every 12 hours as needed for cough and congestion.   If your symptoms are severe, please go to the emergency room for further evaluation. Schedule an appointment with your primary care provider for follow-up and further management of your seasonal allergies as well as ongoing preventive healthcare. I hope you feel better!

## 2022-09-15 DIAGNOSIS — F4323 Adjustment disorder with mixed anxiety and depressed mood: Secondary | ICD-10-CM | POA: Diagnosis not present

## 2022-09-17 ENCOUNTER — Encounter: Payer: Self-pay | Admitting: Physical Therapy

## 2022-09-17 ENCOUNTER — Other Ambulatory Visit: Payer: Self-pay

## 2022-09-17 ENCOUNTER — Ambulatory Visit: Payer: 59 | Attending: Family Medicine | Admitting: Physical Therapy

## 2022-09-17 DIAGNOSIS — M5459 Other low back pain: Secondary | ICD-10-CM | POA: Insufficient documentation

## 2022-09-17 DIAGNOSIS — M542 Cervicalgia: Secondary | ICD-10-CM | POA: Insufficient documentation

## 2022-09-17 DIAGNOSIS — M6281 Muscle weakness (generalized): Secondary | ICD-10-CM | POA: Insufficient documentation
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
## 2022-09-19 ENCOUNTER — Telehealth: Payer: 59 | Admitting: Family Medicine

## 2022-09-19 DIAGNOSIS — H1032 Unspecified acute conjunctivitis, left eye: Secondary | ICD-10-CM

## 2022-09-19 DIAGNOSIS — J40 Bronchitis, not specified as acute or chronic: Secondary | ICD-10-CM | POA: Diagnosis not present

## 2022-09-19 MED ORDER — AZITHROMYCIN 250 MG PO TABS: ORAL_TABLET | ORAL | 0 refills | Status: AC

## 2022-09-19 MED ORDER — MOXIFLOXACIN HCL 0.5 % OP SOLN: 1.0000 [drp] | Freq: Three times a day (TID) | OPHTHALMIC | 0 refills | Status: AC

## 2022-09-19 MED ORDER — PROMETHAZINE-DM 6.25-15 MG/5ML PO SYRP: 5.0000 mL | ORAL_SOLUTION | Freq: Four times a day (QID) | ORAL | 0 refills | Status: AC | PRN

## 2022-09-19 NOTE — Patient Instructions (Signed)
Bacterial Conjunctivitis, Adult Bacterial conjunctivitis is an infection of the clear membrane that covers the white part of the eye and the inner surface of the eyelid (conjunctiva). When the blood vessels in the conjunctiva become inflamed, the eye becomes red or pink. The eye often feels irritated or itchy. Bacterial conjunctivitis spreads easily from person to person (is contagious). It also spreads easily from one eye to the other eye. What are the causes? This condition is caused by bacteria. You may get the infection if you come into close contact with: A person who is infected with the bacteria. Items that are contaminated with the bacteria, such as a face towel, contact lens solution, or eye makeup. What increases the risk? You are more likely to develop this condition if: You are exposed to other people who have the infection. You wear contact lenses. You have a sinus infection. You have had a recent eye injury or surgery. You have a weak body defense system (immune system). You have a medical condition that causes dry eyes. What are the signs or symptoms? Symptoms of this condition include: Thick, yellowish discharge from the eye. This may turn into a crust on the eyelid overnight and cause your eyelids to stick together. Tearing or watery eyes. Itchy eyes. Burning feeling in your eyes. Eye redness. Swollen eyelids. Blurred vision. How is this diagnosed? This condition is diagnosed based on your symptoms and medical history. Your health care provider may also take a sample of discharge from your eye to find the cause of your infection. How is this treated? This condition may be treated with: Antibiotic eye drops or ointment to clear the infection more quickly and prevent the spread of infection to others. Antibiotic medicines taken by mouth (orally) to treat infections that do not respond to drops or ointments or that last longer than 10 days. Cool, wet cloths (cool  compresses) placed on the eyes. Artificial tears applied 2-6 times a day. Follow these instructions at home: Medicines Take or apply your antibiotic medicine as told by your health care provider. Do not stop using the antibiotic, even if your condition improves, unless directed by your health care provider. Take or apply over-the-counter and prescription medicines only as told by your health care provider. Be very careful to avoid touching the edge of your eyelid with the eye-drop bottle or the ointment tube when you apply medicines to the affected eye. This will keep you from spreading the infection to your other eye or to other people. Managing discomfort Gently wipe away any drainage from your eye with a warm, wet washcloth or a cotton ball. Apply a clean, cool compress to your eye for 10-20 minutes, 3-4 times a day. General instructions Do not wear contact lenses until the inflammation is gone and your health care provider says it is safe to wear them again. Ask your health care provider how to sterilize or replace your contact lenses before you use them again. Wear glasses until you can resume wearing contact lenses. Avoid wearing eye makeup until the inflammation is gone. Throw away any old eye cosmetics that may be contaminated. Change or wash your pillowcase every day. Do not share towels or washcloths. This may spread the infection. Wash your hands often with soap and water for at least 20 seconds and especially before touching your face or eyes. Use paper towels to dry your hands. Avoid touching or rubbing your eyes. Do not drive or use heavy machinery if your vision is blurred. Contact  a health care provider if: You have a fever. Your symptoms do not get better after 10 days. Get help right away if: You have a fever and your symptoms suddenly get worse. You have severe pain when you move your eye. You have facial pain, redness, or swelling. You have a sudden loss of  vision. Summary Bacterial conjunctivitis is an infection of the clear membrane that covers the white part of the eye and the inner surface of the eyelid (conjunctiva). Bacterial conjunctivitis spreads easily from eye to eye and from person to person (is contagious). Wash your hands often with soap and water for at least 20 seconds and especially before touching your face or eyes. Use paper towels to dry your hands. Take or apply your antibiotic medicine as told by your health care provider. Do not stop using the antibiotic even if your condition improves. Contact a health care provider if you have a fever or if your symptoms do not get better after 10 days. Get help right away if you have a sudden loss of vision. This information is not intended to replace advice given to you by your health care provider. Make sure you discuss any questions you have with your health care provider. Document Revised: 07/30/2020 Document Reviewed: 07/30/2020 Elsevier Patient Education  2023 [employer] Acute Bronchitis, Adult  Acute bronchitis is sudden inflammation of the main airways (bronchi) that come off the windpipe (trachea) in the lungs. The swelling causes the airways to get smaller and make more mucus than normal. This can make it hard to breathe and can cause coughing or noisy breathing (wheezing). Acute bronchitis may last several weeks. The cough may last longer. Allergies, asthma, and exposure to smoke may make the condition worse. What are the causes? This condition can be caused by germs and by substances that irritate the lungs, including: Cold and flu viruses. The most common cause of this condition is the virus that causes the common cold. Bacteria. This is less common. Breathing in substances that irritate the lungs, including: Smoke from cigarettes and other forms of tobacco. Dust and pollen. Fumes from household cleaning products, gases, or burned fuel. Indoor or outdoor air  pollution. What increases the risk? The following factors may make you more likely to develop this condition: A weak body's defense system, also called the immune system. A condition that affects your lungs and breathing, such as asthma. What are the signs or symptoms? Common symptoms of this condition include: Coughing. This may bring up clear, yellow, or green mucus from your lungs (sputum). Wheezing. Runny or stuffy nose. Having too much mucus in your lungs (chest congestion). Shortness of breath. Aches and pains, including sore throat or chest. How is this diagnosed? This condition is usually diagnosed based on: Your symptoms and medical history. A physical exam. You may also have other tests, including tests to rule out other conditions, such as pneumonia. These tests include: A test of lung function. Test of a mucus sample to look for the presence of bacteria. Tests to check the oxygen level in your blood. Blood tests. Chest X-ray. How is this treated? Most cases of acute bronchitis clear up over time without treatment. Your health care provider may recommend: Drinking more fluids to help thin your mucus so it is easier to cough up. Taking inhaled medicine (inhaler) to improve air flow in and out of your lungs. Using a vaporizer or a humidifier. These are machines that add water to the air to help you  breathe better. Taking a medicine that thins mucus and clears congestion (expectorant). Taking a medicine that prevents or stops coughing (cough suppressant). It is not common to take an antibiotic medicine for this condition. Follow these instructions at home:  Take over-the-counter and prescription medicines only as told by your health care provider. Use an inhaler, vaporizer, or humidifier as told by your health care provider. Take two teaspoons (10 mL) of honey at bedtime to lessen coughing at night. Drink enough fluid to keep your urine pale yellow. Do not use any  products that contain nicotine or tobacco. These products include cigarettes, chewing tobacco, and vaping devices, such as e-cigarettes. If you need help quitting, ask your health care provider. Get plenty of rest. Return to your normal activities as told by your health care provider. Ask your health care provider what activities are safe for you. Keep all follow-up visits. This is important. How is this prevented? To lower your risk of getting this condition again: Wash your hands often with soap and water for at least 20 seconds. If soap and water are not available, use hand sanitizer. Avoid contact with people who have cold symptoms. Try not to touch your mouth, nose, or eyes with your hands. Avoid breathing in smoke or chemical fumes. Breathing smoke or chemical fumes will make your condition worse. Get the flu shot every year. Contact a health care provider if: Your symptoms do not improve after 2 weeks. You have trouble coughing up the mucus. Your cough keeps you awake at night. You have a fever. Get help right away if you: Cough up blood. Feel pain in your chest. Have severe shortness of breath. Faint or keep feeling like you are going to faint. Have a severe headache. Have a fever or chills that get worse. These symptoms may represent a serious problem that is an emergency. Do not wait to see if the symptoms will go away. Get medical help right away. Call your local emergency services (911 in the [place] Do not drive yourself to the hospital. Summary Acute bronchitis is inflammation of the main airways (bronchi) that come off the windpipe (trachea) in the lungs. The swelling causes the airways to get smaller and make more mucus than normal. Drinking more fluids can help thin your mucus so it is easier to cough up. Take over-the-counter and prescription medicines only as told by your health care provider. Do not use any products that contain nicotine or tobacco. These products  include cigarettes, chewing tobacco, and vaping devices, such as e-cigarettes. If you need help quitting, ask your health care provider. Contact a health care provider if your symptoms do not improve after 2 weeks. This information is not intended to replace advice given to you by your health care provider. Make sure you discuss any questions you have with your health care provider. Document Revised: 07/30/2021 Document Reviewed: 08/20/2020 Elsevier Patient Education  2023 [employer].

## 2022-09-19 NOTE — Progress Notes (Signed)
Virtual Visit Consent   Gloria Estes, you are scheduled for a virtual visit with a [facility] provider today. Just as with appointments in the office, your consent must be obtained to participate. Your consent will be active for this visit and any virtual visit you may have with one of our providers in the next 365 days. If you have a MyChart account, a copy of this consent can be sent to you electronically.  As this is a virtual visit, video technology does not allow for your provider to perform a traditional examination. This may limit your provider's ability to fully assess your condition. If your provider identifies any concerns that need to be evaluated in person or the need to arrange testing (such as labs, EKG, etc.), we will make arrangements to do so. Although advances in technology are sophisticated, we cannot ensure that it will always work on either your end or our end. If the connection with a video visit is poor, the visit may have to be switched to a telephone visit. With either a video or telephone visit, we are not always able to ensure that we have a secure connection.  By engaging in this virtual visit, you consent to the provision of healthcare and authorize for your insurance to be billed (if applicable) for the services provided during this visit. Depending on your insurance coverage, you may receive a charge related to this service.  I need to obtain your verbal consent now. Are you willing to proceed with your visit today? Gloria Estes has provided verbal consent on 09/19/2022 for a virtual visit (video or telephone). Georgana Curio, FNP  Date: 09/19/2022 8:54 AM  Virtual Visit via Video Note   I, Georgana Curio, connected with  Gloria Estes  ([id number], 02/16/1996) on 09/19/22 at  8:45 AM EDT by a video-enabled telemedicine application and verified that I am speaking with the correct person using two identifiers.  Location: Patient: Virtual Visit Location Patient:  Home Provider: Virtual Visit Location Provider: Home Office   I discussed the limitations of evaluation and management by telemedicine and the availability of in person appointments. The patient expressed understanding and agreed to proceed.    History of Present Illness: Gloria Estes is a 27 y.o. who identifies as a female who was assigned female at birth, and is being seen today for redness, irritation and yellow drainage from left eye, cough for 2 months treated with tessalon not improving. Allergy testing neg. No fever. No wheezing or sob.   HPI: HPI  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    Allergies:  Allergies  Allergen Reactions   Tape Itching and Other (See Comments)   Medications:  Current Outpatient Medications:    azithromycin (ZITHROMAX) 250 MG tablet, Take 2 tablets on day 1, then 1 tablet daily on days 2 through 5, Disp: 6 tablet, Rfl: 0   moxifloxacin (VIGAMOX) 0.5 % ophthalmic solution, Place 1 drop into the left eye 3 (three) times daily for 7 days., Disp: 3 mL, Rfl: 0   promethazine-dextromethorphan (PROMETHAZINE-DM) 6.25-15 MG/5ML syrup, Take 5 mLs by mouth 4 (four) times daily as needed for up to 10 days for cough., Disp: 118 mL, Rfl:  0   acetaminophen (TYLENOL) 500 MG tablet, Take 2 tablets (1,000 mg total) by mouth every 8 (eight) hours as needed., Disp: 30 tablet, Rfl: 0   benzonatate (TESSALON) 100 MG capsule, Take 1 capsule (100 mg total) by mouth every 8 (eight) hours., Disp: 21 capsule, Rfl: 0   cyclobenzaprine (FLEXERIL) 10 MG tablet, Take 1 tablet (10 mg total) by mouth 2 (two) times daily as needed for muscle spasms., Disp: 20 tablet, Rfl: 0    FLOVENT HFA 220 MCG/ACT inhaler, Inhale 2 puffs into the lungs 2 (two) times daily., Disp: , Rfl:    fluticasone (FLONASE) 50 MCG/ACT nasal spray, Place 1 spray into both nostrils daily., Disp: 11.1 mL, Rfl: 2   guaiFENesin (MUCINEX) 600 MG 12 hr tablet, Take 2 tablets (1,200 mg total) by mouth 2 (two) times daily., Disp: 30 tablet, Rfl: 0   ketorolac (TORADOL) 10 MG tablet, Take 1 tablet (10 mg total) by mouth every 6 (six) hours as needed (pain)., Disp: 20 tablet, Rfl: 0   olopatadine (PATANOL) 0.1 % ophthalmic solution, Place 1 drop into both eyes 2 (two) times daily., Disp: 5 mL, Rfl: 12   Prenatal 28-0.8 MG TABS, Take 1 tablet by mouth daily., Disp: , Rfl:   Observations/Objective: Patient is well-developed, well-nourished in no acute distress.  Resting comfortably  at home.  Head is normocephalic, atraumatic.  No labored breathing.  Speech is clear and coherent with logical content.  Patient is alert and oriented at baseline.    Assessment and Plan: 1. Acute bacterial conjunctivitis of left eye  2. Bronchitis  Increase fluids, humidifier at night, UC if sx worsen.   Follow Up Instructions: I discussed the assessment and treatment plan with the patient. The patient was provided an opportunity to ask questions and all were answered. The patient agreed with the plan and demonstrated an understanding of the instructions.  A copy of instructions were sent to the patient via MyChart unless otherwise noted below.     The patient was advised to call back or seek an in-person evaluation if the symptoms worsen or if the condition fails to improve as anticipated.  Time:  I spent 10 minutes with the patient via telehealth technology discussing the above problems/concerns.    Georgana Curio, FNP

## 2022-09-20 DIAGNOSIS — F4323 Adjustment disorder with mixed anxiety and depressed mood: Secondary | ICD-10-CM | POA: Diagnosis not present

## 2022-09-28 ENCOUNTER — Ambulatory Visit: Payer: 59 | Admitting: Physical Therapy

## 2022-10-04 DIAGNOSIS — F4323 Adjustment disorder with mixed anxiety and depressed mood: Secondary | ICD-10-CM | POA: Diagnosis not present

## 2022-10-07 ENCOUNTER — Ambulatory Visit: Payer: 59 | Attending: Family Medicine | Admitting: Physical Therapy

## 2022-10-07 ENCOUNTER — Encounter: Payer: Self-pay | Admitting: Physical Therapy

## 2022-10-07 DIAGNOSIS — M542 Cervicalgia: Secondary | ICD-10-CM | POA: Diagnosis present

## 2022-10-07 DIAGNOSIS — M6281 Muscle weakness (generalized): Secondary | ICD-10-CM | POA: Diagnosis present

## 2022-10-07 DIAGNOSIS — M5459 Other low back pain: Secondary | ICD-10-CM | POA: Diagnosis present
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
## 2022-10-11 DIAGNOSIS — F4323 Adjustment disorder with mixed anxiety and depressed mood: Secondary | ICD-10-CM | POA: Diagnosis not present

## 2022-10-14 ENCOUNTER — Ambulatory Visit: Payer: 59 | Admitting: Physical Therapy

## 2022-10-14 ENCOUNTER — Encounter: Payer: Self-pay | Admitting: Physical Therapy

## 2022-10-14 DIAGNOSIS — M542 Cervicalgia: Secondary | ICD-10-CM

## 2022-10-14 DIAGNOSIS — M6281 Muscle weakness (generalized): Secondary | ICD-10-CM

## 2022-10-14 DIAGNOSIS — M5459 Other low back pain: Secondary | ICD-10-CM
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
## 2022-10-18 DIAGNOSIS — F4323 Adjustment disorder with mixed anxiety and depressed mood: Secondary | ICD-10-CM | POA: Diagnosis not present

## 2022-10-19 LAB — OB RESULTS CONSOLE RUBELLA ANTIBODY, IGM: Rubella: IMMUNE

## 2022-10-19 LAB — OB RESULTS CONSOLE HEPATITIS B SURFACE ANTIGEN: Hepatitis B Surface Ag: NEGATIVE

## 2022-10-19 LAB — HEPATITIS C ANTIBODY: HCV Ab: NEGATIVE

## 2022-10-19 LAB — OB RESULTS CONSOLE HIV ANTIBODY (ROUTINE TESTING): HIV: NONREACTIVE

## 2022-10-21 ENCOUNTER — Ambulatory Visit: Payer: 59 | Admitting: Physical Therapy

## 2022-11-01 DIAGNOSIS — F4323 Adjustment disorder with mixed anxiety and depressed mood: Secondary | ICD-10-CM | POA: Diagnosis not present

## 2022-11-04 ENCOUNTER — Encounter (HOSPITAL_COMMUNITY): Payer: Self-pay | Admitting: *Deleted

## 2022-11-04 ENCOUNTER — Inpatient Hospital Stay (HOSPITAL_COMMUNITY)
Admission: AD | Admit: 2022-11-04 | Discharge: 2022-11-05 | Disposition: A | Discharge status: Home / Self Care | Payer: 59 | Attending: Obstetrics & Gynecology | Admitting: Obstetrics & Gynecology

## 2022-11-04 DIAGNOSIS — Z3A09 9 weeks gestation of pregnancy: Secondary | ICD-10-CM | POA: Diagnosis not present

## 2022-11-04 DIAGNOSIS — R519 Headache, unspecified: Secondary | ICD-10-CM | POA: Diagnosis not present

## 2022-11-04 DIAGNOSIS — O26891 Other specified pregnancy related conditions, first trimester: Secondary | ICD-10-CM | POA: Diagnosis present

## 2022-11-04 DIAGNOSIS — O09291 Supervision of pregnancy with other poor reproductive or obstetric history, first trimester: Secondary | ICD-10-CM | POA: Diagnosis not present

## 2022-11-04 DIAGNOSIS — O219 Vomiting of pregnancy, unspecified: Secondary | ICD-10-CM | POA: Insufficient documentation

## 2022-11-04 LAB — URINALYSIS, ROUTINE W REFLEX MICROSCOPIC
Bilirubin Urine: NEGATIVE
Glucose, UA: NEGATIVE mg/dL
Hgb urine dipstick: NEGATIVE
Ketones, ur: 5 mg/dL — AB
Leukocytes,Ua: NEGATIVE
Nitrite: NEGATIVE
Protein, ur: NEGATIVE mg/dL
Specific Gravity, Urine: 1.024 (ref 1.005–1.030)
pH: 5 (ref 5.0–8.0)

## 2022-11-04 MED ORDER — METOCLOPRAMIDE HCL 5 MG/ML IJ SOLN
10.0000 mg | Freq: Once | INTRAMUSCULAR | Status: AC
Start: 2022-11-04 — End: 2022-11-04
  Administered 2022-11-04: 10 mg via INTRAVENOUS
  Filled 2022-11-04: qty 2

## 2022-11-04 MED ORDER — LACTATED RINGERS IV BOLUS
1000.0000 mL | Freq: Once | INTRAVENOUS | Status: AC
  Administered 2022-11-04: 1000 mL via INTRAVENOUS

## 2022-11-04 MED ORDER — DIPHENHYDRAMINE HCL 50 MG/ML IJ SOLN
25.0000 mg | Freq: Once | INTRAMUSCULAR | Status: AC
  Administered 2022-11-04: 25 mg via INTRAVENOUS
  Filled 2022-11-04: qty 1

## 2022-11-04 NOTE — MAU Note (Signed)
.  Gloria Estes is a 27 y.o. at [redacted]w[redacted]d here in MAU reporting n/v since 5wks. Has meds but not helping. Does have subchorionic hemorrhage and last spotting was Sun/Mon. Just does not feel well from n/v. Has h/a  Onset of complaint:  5 wks but worse last few day Pain score: 7 Vitals:   11/04/22 2150  Pulse: 84  Resp: 17  Temp: 98.5 F (36.9 C)  SpO2: 100%     FHT:n/a Lab orders placed from triage:  u/a

## 2022-11-04 NOTE — MAU Provider Note (Signed)
History     CSN: [id number]  Arrival date and time: 11/04/22 2140   Event Date/Time   First Provider Initiated Contact with Patient 11/04/22 2217      Chief Complaint  Patient presents with   Emesis During Pregnancy    Gloria Estes is a 27 y.o. [id number] at [redacted]w[redacted]d who receives care at [facility].  She states her next appt is the July 30th.  She presents today for nausea, vomiting, and HA.  She reports her nausea has been present since 5 weeks.  She states she takes B6, Unisom, Magnesium, Reglan, and Zofran with no relief of symptoms.  She reports last dosing of Reglan, B6 and Mg at 6pm.    She reports she has had more than 10 bouts of vomiting today.  She states she has tried to drink water to no avail. She states she has tried eating 1/2 pancake, mint, crackers, and a piece of cheese.   She reports her HA started two days ago and contributed it to sleeping wrong because she woke up with it.  She describes it as a "dizzy type headache, but not like a tension HA."  She goes on to elaborate stating it "feels like my head is full." She states she tried tylenol, at 3pm, with no relief. She reports relief with sleeping and worsening with vomiting.   OB History     Gravida  5   Para  2   Term  1   Preterm  1   AB  2   Living  2      SAB      IAB  2   Ectopic      Multiple  0   Live Births  2           History reviewed. No pertinent past medical history.  Past Surgical History:  Procedure Laterality Date   CESAREAN SECTION N/A 05/03/2021   Procedure: CESAREAN SECTION;  Surgeon: Gerald Leitz, MD;  Location: [facility];  Service: Obstetrics;  Laterality: N/A;  Primary C/S PPROM Breech   DILATION AND CURETTAGE OF UTERUS      Family History  Problem Relation Age of Onset   Healthy Mother    Healthy Father     Social History   Tobacco Use   Smoking status: Never   Smokeless tobacco: Never  Vaping Use   Vaping Use: Never used  Substance Use Topics   Alcohol  use: Not Currently    Comment: social   Drug use: Never    Allergies:  Allergies  Allergen Reactions   Tape Itching and Other (See Comments)    Medications Prior to Admission  Medication Sig Dispense Refill Last Dose   doxylamine, Sleep, (UNISOM) 25 MG tablet Take 25 mg by mouth at bedtime as needed.      fluticasone (FLONASE) 50 MCG/ACT nasal spray Place 1 spray into both nostrils daily. 11.1 mL 2 Past Month   ondansetron (ZOFRAN-ODT) 4 MG disintegrating tablet Take 4 mg by mouth every 8 (eight) hours as needed for nausea or vomiting.   11/04/2022   pyridOXINE (B-6) 50 MG tablet Take 50 mg by mouth daily.      acetaminophen (TYLENOL) 500 MG tablet Take 2 tablets (1,000 mg total) by mouth every 8 (eight) hours as needed. 30 tablet 0    benzonatate (TESSALON) 100 MG capsule Take 1 capsule (100 mg total) by mouth every 8 (eight) hours. (Patient not taking: Reported on 11/04/2022) 21  capsule 0 Not Taking   cyclobenzaprine (FLEXERIL) 10 MG tablet Take 1 tablet (10 mg total) by mouth 2 (two) times daily as needed for muscle spasms. (Patient not taking: Reported on 11/04/2022) 20 tablet 0 Not Taking   FLOVENT HFA 220 MCG/ACT inhaler Inhale 2 puffs into the lungs 2 (two) times daily. Needs a refill on this      guaiFENesin (MUCINEX) 600 MG 12 hr tablet Take 2 tablets (1,200 mg total) by mouth 2 (two) times daily. (Patient not taking: Reported on 11/04/2022) 30 tablet 0 Not Taking   ketorolac (TORADOL) 10 MG tablet Take 1 tablet (10 mg total) by mouth every 6 (six) hours as needed (pain). 20 tablet 0    olopatadine (PATANOL) 0.1 % ophthalmic solution Place 1 drop into both eyes 2 (two) times daily. 5 mL 12    Prenatal 28-0.8 MG TABS Take 1 tablet by mouth daily.       Review of Systems  Gastrointestinal:  Positive for abdominal pain (Light cramping), constipation (Week and half ago), nausea and vomiting. Negative for diarrhea.  Genitourinary:  Positive for vaginal bleeding and vaginal discharge.  Negative for difficulty urinating and dysuria.  Neurological:  Positive for dizziness, light-headedness and headaches (7/10).   Physical Exam   Blood pressure (!) 108/91, pulse 84, temperature 98.5 F (36.9 C), resp. rate 17, height 5\' 9"  (1.753 m), weight 71.7 kg, last menstrual period 08/04/2022, SpO2 100 %, unknown if currently breastfeeding.  Physical Exam Constitutional:      Appearance: Normal appearance.  HENT:     Head: Normocephalic and atraumatic.  Eyes:     Conjunctiva/sclera: Conjunctivae normal.  Cardiovascular:     Rate and Rhythm: Normal rate.  Pulmonary:     Effort: Pulmonary effort is normal. No respiratory distress.  Musculoskeletal:        General: Normal range of motion.     Cervical back: Normal range of motion.  Skin:    General: Skin is warm and dry.  Neurological:     Mental Status: She is alert and oriented to person, place, and time.  Psychiatric:        Mood and Affect: Mood normal.        Behavior: Behavior normal.     MAU Course  Procedures Results for orders placed or performed during the hospital encounter of 11/04/22 (from the past 24 hour(s))  Urinalysis, Routine w reflex microscopic -Urine, Clean Catch     Status: Abnormal   Collection Time: 11/04/22  9:56 PM  Result Value Ref Range   Color, Urine YELLOW YELLOW   APPearance HAZY (A) CLEAR   Specific Gravity, Urine 1.024 1.005 - 1.030   pH 5.0 5.0 - 8.0   Glucose, UA NEGATIVE NEGATIVE mg/dL   Hgb urine dipstick NEGATIVE NEGATIVE   Bilirubin Urine NEGATIVE NEGATIVE   Ketones, ur 5 (A) NEGATIVE mg/dL   Protein, ur NEGATIVE NEGATIVE mg/dL   Nitrite NEGATIVE NEGATIVE   Leukocytes,Ua NEGATIVE NEGATIVE    MDM Start IV LR Bolus Antiemetic H2 Blocker Assessment and Plan  27 year old, [id number]  SIUP at 9.3 weeks Nausea and Vomiting Headache  -Reviewed POC with patient. -Exam performed and findings discussed.  -Start IV give LR Bolus -Reglan and Benadryl.  -Monitor and  reassess  Cherre Robins 11/04/2022, 10:17 PM   Reassessment (12:32 AM) -Patient reports relief in nausea. -States HA with some improvement, but feels tired and thinks sleep would help. -Discussed nutritional intake to promote decreased symptoms. -Instructed  to take B6 and Unisom tonight. -Precautions reviewed. -Encouraged to call primary office or return to MAU if symptoms worsen or with the onset of new symptoms. -Discharged to home in stable condition.  Cherre Robins MSN, CNM Advanced Practice Provider, [facility]

## 2022-11-05 DIAGNOSIS — O219 Vomiting of pregnancy, unspecified: Secondary | ICD-10-CM

## 2022-11-05 DIAGNOSIS — Z3A09 9 weeks gestation of pregnancy: Secondary | ICD-10-CM

## 2022-11-05 NOTE — Progress Notes (Addendum)
Verbal d/c instructions given and understanding vocied. Pt received work note and states will use My CH[employer] for AVS

## 2022-11-08 DIAGNOSIS — F4323 Adjustment disorder with mixed anxiety and depressed mood: Secondary | ICD-10-CM | POA: Diagnosis not present

## 2022-11-15 DIAGNOSIS — F4323 Adjustment disorder with mixed anxiety and depressed mood: Secondary | ICD-10-CM | POA: Diagnosis not present

## 2022-11-22 DIAGNOSIS — F4323 Adjustment disorder with mixed anxiety and depressed mood: Secondary | ICD-10-CM | POA: Diagnosis not present

## 2022-12-06 DIAGNOSIS — F4323 Adjustment disorder with mixed anxiety and depressed mood: Secondary | ICD-10-CM | POA: Diagnosis not present

## 2022-12-28 ENCOUNTER — Other Ambulatory Visit: Payer: Self-pay | Admitting: Obstetrics and Gynecology

## 2022-12-28 DIAGNOSIS — Z363 Encounter for antenatal screening for malformations: Secondary | ICD-10-CM

## 2022-12-28 DIAGNOSIS — Q213 Tetralogy of Fallot: Secondary | ICD-10-CM

## 2022-12-28 DIAGNOSIS — O09219 Supervision of pregnancy with history of pre-term labor, unspecified trimester: Secondary | ICD-10-CM

## 2022-12-28 DIAGNOSIS — F4323 Adjustment disorder with mixed anxiety and depressed mood: Secondary | ICD-10-CM | POA: Diagnosis not present

## 2023-01-05 DIAGNOSIS — F4323 Adjustment disorder with mixed anxiety and depressed mood: Secondary | ICD-10-CM | POA: Diagnosis not present

## 2023-01-10 DIAGNOSIS — F4323 Adjustment disorder with mixed anxiety and depressed mood: Secondary | ICD-10-CM | POA: Diagnosis not present

## 2023-01-11 ENCOUNTER — Encounter: Payer: Self-pay | Admitting: *Deleted

## 2023-01-11 DIAGNOSIS — O4402 Placenta previa specified as without hemorrhage, second trimester: Secondary | ICD-10-CM | POA: Insufficient documentation

## 2023-01-11 DIAGNOSIS — O34219 Maternal care for unspecified type scar from previous cesarean delivery: Secondary | ICD-10-CM | POA: Insufficient documentation

## 2023-01-13 ENCOUNTER — Other Ambulatory Visit: Payer: Self-pay | Admitting: *Deleted

## 2023-01-13 ENCOUNTER — Ambulatory Visit: Payer: 59 | Attending: Obstetrics and Gynecology

## 2023-01-13 ENCOUNTER — Ambulatory Visit: Payer: 59

## 2023-01-13 VITALS — BP 117/71 | HR 89

## 2023-01-13 DIAGNOSIS — O42919 Preterm premature rupture of membranes, unspecified as to length of time between rupture and onset of labor, unspecified trimester: Secondary | ICD-10-CM

## 2023-01-13 DIAGNOSIS — Q213 Tetralogy of Fallot: Secondary | ICD-10-CM | POA: Insufficient documentation

## 2023-01-13 DIAGNOSIS — O09292 Supervision of pregnancy with other poor reproductive or obstetric history, second trimester: Secondary | ICD-10-CM | POA: Diagnosis not present

## 2023-01-13 DIAGNOSIS — Z8759 Personal history of other complications of pregnancy, childbirth and the puerperium: Secondary | ICD-10-CM

## 2023-01-13 DIAGNOSIS — O09219 Supervision of pregnancy with history of pre-term labor, unspecified trimester: Secondary | ICD-10-CM | POA: Insufficient documentation

## 2023-01-13 DIAGNOSIS — O4402 Placenta previa specified as without hemorrhage, second trimester: Secondary | ICD-10-CM

## 2023-01-13 DIAGNOSIS — O34219 Maternal care for unspecified type scar from previous cesarean delivery: Secondary | ICD-10-CM | POA: Insufficient documentation

## 2023-01-13 DIAGNOSIS — O4442 Low lying placenta NOS or without hemorrhage, second trimester: Secondary | ICD-10-CM

## 2023-01-13 DIAGNOSIS — Z3A19 19 weeks gestation of pregnancy: Secondary | ICD-10-CM

## 2023-01-13 DIAGNOSIS — Z363 Encounter for antenatal screening for malformations: Secondary | ICD-10-CM | POA: Insufficient documentation

## 2023-01-13 DIAGNOSIS — O09212 Supervision of pregnancy with history of pre-term labor, second trimester: Secondary | ICD-10-CM

## 2023-01-13 DIAGNOSIS — O09299 Supervision of pregnancy with other poor reproductive or obstetric history, unspecified trimester: Secondary | ICD-10-CM

## 2023-01-17 DIAGNOSIS — F4323 Adjustment disorder with mixed anxiety and depressed mood: Secondary | ICD-10-CM | POA: Diagnosis not present

## 2023-01-31 DIAGNOSIS — F4323 Adjustment disorder with mixed anxiety and depressed mood: Secondary | ICD-10-CM | POA: Diagnosis not present

## 2023-02-07 DIAGNOSIS — F4323 Adjustment disorder with mixed anxiety and depressed mood: Secondary | ICD-10-CM | POA: Diagnosis not present

## 2023-02-10 ENCOUNTER — Ambulatory Visit: Payer: 59 | Attending: Obstetrics

## 2023-02-10 ENCOUNTER — Ambulatory Visit: Payer: 59 | Admitting: *Deleted

## 2023-02-10 DIAGNOSIS — Z8759 Personal history of other complications of pregnancy, childbirth and the puerperium: Secondary | ICD-10-CM | POA: Insufficient documentation

## 2023-02-10 DIAGNOSIS — O34219 Maternal care for unspecified type scar from previous cesarean delivery: Secondary | ICD-10-CM | POA: Diagnosis not present

## 2023-02-10 DIAGNOSIS — O4442 Low lying placenta NOS or without hemorrhage, second trimester: Secondary | ICD-10-CM | POA: Insufficient documentation

## 2023-02-10 DIAGNOSIS — O09292 Supervision of pregnancy with other poor reproductive or obstetric history, second trimester: Secondary | ICD-10-CM

## 2023-02-10 DIAGNOSIS — O09299 Supervision of pregnancy with other poor reproductive or obstetric history, unspecified trimester: Secondary | ICD-10-CM | POA: Diagnosis present

## 2023-02-10 DIAGNOSIS — O09212 Supervision of pregnancy with history of pre-term labor, second trimester: Secondary | ICD-10-CM | POA: Diagnosis not present

## 2023-02-10 DIAGNOSIS — Z3A23 23 weeks gestation of pregnancy: Secondary | ICD-10-CM

## 2023-02-14 DIAGNOSIS — F4323 Adjustment disorder with mixed anxiety and depressed mood: Secondary | ICD-10-CM | POA: Diagnosis not present

## 2023-02-21 DIAGNOSIS — F4323 Adjustment disorder with mixed anxiety and depressed mood: Secondary | ICD-10-CM | POA: Diagnosis not present

## 2023-03-07 DIAGNOSIS — F4323 Adjustment disorder with mixed anxiety and depressed mood: Secondary | ICD-10-CM | POA: Diagnosis not present

## 2023-03-14 DIAGNOSIS — F4323 Adjustment disorder with mixed anxiety and depressed mood: Secondary | ICD-10-CM | POA: Diagnosis not present

## 2023-03-14 LAB — OB RESULTS CONSOLE RPR: RPR: NONREACTIVE

## 2023-03-22 DIAGNOSIS — F4323 Adjustment disorder with mixed anxiety and depressed mood: Secondary | ICD-10-CM | POA: Diagnosis not present

## 2023-03-28 DIAGNOSIS — F4323 Adjustment disorder with mixed anxiety and depressed mood: Secondary | ICD-10-CM | POA: Diagnosis not present

## 2023-04-12 DIAGNOSIS — F4323 Adjustment disorder with mixed anxiety and depressed mood: Secondary | ICD-10-CM | POA: Diagnosis not present

## 2023-04-20 ENCOUNTER — Inpatient Hospital Stay (HOSPITAL_BASED_OUTPATIENT_CLINIC_OR_DEPARTMENT_OTHER)
Admission: EM | Admit: 2023-04-20 | Discharge: 2023-04-20 | Disposition: A | Discharge status: Home / Self Care | Payer: 59 | Attending: Obstetrics & Gynecology | Admitting: Obstetrics & Gynecology

## 2023-04-20 ENCOUNTER — Other Ambulatory Visit: Payer: Self-pay

## 2023-04-20 ENCOUNTER — Inpatient Hospital Stay (HOSPITAL_COMMUNITY): Payer: 59

## 2023-04-20 ENCOUNTER — Emergency Department (HOSPITAL_BASED_OUTPATIENT_CLINIC_OR_DEPARTMENT_OTHER): Payer: 59

## 2023-04-20 ENCOUNTER — Encounter (HOSPITAL_BASED_OUTPATIENT_CLINIC_OR_DEPARTMENT_OTHER): Payer: Self-pay | Admitting: Emergency Medicine

## 2023-04-20 ENCOUNTER — Ambulatory Visit
Admission: EM | Admit: 2023-04-20 | Discharge: 2023-04-20 | Disposition: A | Discharge status: Home / Self Care | Payer: 59 | Attending: Internal Medicine | Admitting: Internal Medicine

## 2023-04-20 DIAGNOSIS — R000 Tachycardia, unspecified: Secondary | ICD-10-CM

## 2023-04-20 DIAGNOSIS — Z3A33 33 weeks gestation of pregnancy: Secondary | ICD-10-CM | POA: Insufficient documentation

## 2023-04-20 DIAGNOSIS — O288 Other abnormal findings on antenatal screening of mother: Secondary | ICD-10-CM | POA: Diagnosis not present

## 2023-04-20 DIAGNOSIS — O99013 Anemia complicating pregnancy, third trimester: Secondary | ICD-10-CM | POA: Diagnosis not present

## 2023-04-20 DIAGNOSIS — N858 Other specified noninflammatory disorders of uterus: Secondary | ICD-10-CM | POA: Diagnosis not present

## 2023-04-20 DIAGNOSIS — R0781 Pleurodynia: Secondary | ICD-10-CM | POA: Insufficient documentation

## 2023-04-20 DIAGNOSIS — O34219 Maternal care for unspecified type scar from previous cesarean delivery: Secondary | ICD-10-CM | POA: Insufficient documentation

## 2023-04-20 DIAGNOSIS — O99513 Diseases of the respiratory system complicating pregnancy, third trimester: Secondary | ICD-10-CM | POA: Diagnosis not present

## 2023-04-20 DIAGNOSIS — O09213 Supervision of pregnancy with history of pre-term labor, third trimester: Secondary | ICD-10-CM | POA: Insufficient documentation

## 2023-04-20 DIAGNOSIS — J069 Acute upper respiratory infection, unspecified: Secondary | ICD-10-CM | POA: Diagnosis not present

## 2023-04-20 DIAGNOSIS — R079 Chest pain, unspecified: Secondary | ICD-10-CM | POA: Diagnosis present

## 2023-04-20 DIAGNOSIS — R042 Hemoptysis: Secondary | ICD-10-CM | POA: Diagnosis not present

## 2023-04-20 DIAGNOSIS — O09293 Supervision of pregnancy with other poor reproductive or obstetric history, third trimester: Secondary | ICD-10-CM | POA: Diagnosis not present

## 2023-04-20 DIAGNOSIS — D649 Anemia, unspecified: Secondary | ICD-10-CM | POA: Insufficient documentation

## 2023-04-20 DIAGNOSIS — O4693 Antepartum hemorrhage, unspecified, third trimester: Secondary | ICD-10-CM | POA: Diagnosis present

## 2023-04-20 LAB — COMPREHENSIVE METABOLIC PANEL
ALT: 11 U/L (ref 0–44)
AST: 17 U/L (ref 15–41)
Albumin: 2.6 g/dL — ABNORMAL LOW (ref 3.5–5.0)
Alkaline Phosphatase: 90 U/L (ref 38–126)
Anion gap: 6 (ref 5–15)
BUN: 10 mg/dL (ref 6–20)
CO2: 20 mmol/L — ABNORMAL LOW (ref 22–32)
Calcium: 8.4 mg/dL — ABNORMAL LOW (ref 8.9–10.3)
Chloride: 105 mmol/L (ref 98–111)
Creatinine, Ser: 0.53 mg/dL (ref 0.44–1.00)
GFR, Estimated: >60 mL/min (ref >60)
Glucose, Bld: 85 mg/dL (ref 70–99)
Potassium: 3.9 mmol/L (ref 3.5–5.1)
Sodium: 131 mmol/L — ABNORMAL LOW (ref 135–145)
Total Bilirubin: 0.3 mg/dL (ref <1.2)
Total Protein: 6.7 g/dL (ref 6.5–8.1)

## 2023-04-20 LAB — RESP PANEL BY RT-PCR (RSV, FLU A&B, COVID)  RVPGX2
Influenza A by PCR: NEGATIVE
Influenza B by PCR: NEGATIVE
Resp Syncytial Virus by PCR: NEGATIVE
SARS Coronavirus 2 by RT PCR: NEGATIVE

## 2023-04-20 LAB — RESPIRATORY PANEL BY PCR
Adenovirus: NOT DETECTED
Bordetella Parapertussis: NOT DETECTED
Bordetella pertussis: NOT DETECTED
Chlamydophila pneumoniae: NOT DETECTED
Coronavirus 229E: NOT DETECTED
Coronavirus HKU1: NOT DETECTED
Coronavirus NL63: DETECTED — AB
Coronavirus OC43: NOT DETECTED
Influenza A: NOT DETECTED
Influenza B: NOT DETECTED
Metapneumovirus: NOT DETECTED
Mycoplasma pneumoniae: NOT DETECTED
Parainfluenza Virus 1: NOT DETECTED
Parainfluenza Virus 2: NOT DETECTED
Parainfluenza Virus 3: NOT DETECTED
Parainfluenza Virus 4: NOT DETECTED
Respiratory Syncytial Virus: NOT DETECTED
Rhinovirus / Enterovirus: DETECTED — AB

## 2023-04-20 LAB — CBC WITH DIFFERENTIAL/PLATELET
Abs Immature Granulocytes: 0.12 10*3/uL — ABNORMAL HIGH (ref 0.00–0.07)
Basophils Absolute: 0 10*3/uL (ref 0.0–0.1)
Basophils Relative: 0 %
Eosinophils Absolute: 0.1 10*3/uL (ref 0.0–0.5)
Eosinophils Relative: 1 %
HCT: 30.2 % — ABNORMAL LOW (ref 36.0–46.0)
Hemoglobin: 10 g/dL — ABNORMAL LOW (ref 12.0–15.0)
Immature Granulocytes: 1 %
Lymphocytes Relative: 13 %
Lymphs Abs: 1.3 10*3/uL (ref 0.7–4.0)
MCH: 30.7 pg (ref 26.0–34.0)
MCHC: 33.1 g/dL (ref 30.0–36.0)
MCV: 92.6 fL (ref 80.0–100.0)
Monocytes Absolute: 0.9 10*3/uL (ref 0.1–1.0)
Monocytes Relative: 9 %
Neutro Abs: 7.8 10*3/uL — ABNORMAL HIGH (ref 1.7–7.7)
Neutrophils Relative %: 76 %
Platelets: 227 10*3/uL (ref 150–400)
RBC: 3.26 MIL/uL — ABNORMAL LOW (ref 3.87–5.11)
RDW: 13.2 % (ref 11.5–15.5)
WBC: 10.2 10*3/uL (ref 4.0–10.5)
nRBC: 0 % (ref 0.0–0.2)

## 2023-04-20 LAB — URINALYSIS, ROUTINE W REFLEX MICROSCOPIC
Bilirubin Urine: NEGATIVE
Glucose, UA: NEGATIVE mg/dL
Hgb urine dipstick: NEGATIVE
Ketones, ur: NEGATIVE mg/dL
Leukocytes,Ua: NEGATIVE
Nitrite: NEGATIVE
Protein, ur: NEGATIVE mg/dL
Specific Gravity, Urine: 1.008 (ref 1.005–1.030)
pH: 7 (ref 5.0–8.0)

## 2023-04-20 MED ORDER — CYCLOBENZAPRINE HCL 5 MG PO TABS
10.0000 mg | ORAL_TABLET | Freq: Once | ORAL | Status: AC
  Administered 2023-04-20: 10 mg via ORAL
  Filled 2023-04-20: qty 2

## 2023-04-20 MED ORDER — NIFEDIPINE 10 MG PO CAPS
10.0000 mg | ORAL_CAPSULE | ORAL | Status: DC | PRN
  Filled 2023-04-20: qty 1

## 2023-04-20 MED ORDER — SODIUM CHLORIDE 0.9 % IV BOLUS
1000.0000 mL | Freq: Once | INTRAVENOUS | Status: AC
  Administered 2023-04-20: 1000 mL via INTRAVENOUS

## 2023-04-20 NOTE — Progress Notes (Addendum)
RROB nurse called and notified of pt who is G5P2 at 33w 2d presenting to [facility] who complains of chest congestion/chest pain/cough.  She currently has no OB complaints, but has a history of preterm delivery. FHR monitors applied.  RROB to monitor remotely.

## 2023-04-20 NOTE — Progress Notes (Signed)
RROB called and asked [facility] to adjust FHR monitors

## 2023-04-20 NOTE — Discharge Instructions (Signed)
Safe Medications in Pregnancy   Acne: Benzoyl Peroxide Salicylic Acid  Backache/Headache: Tylenol: 2 regular strength every 4 hours OR              2 Extra strength every 6 hours  Colds/Coughs/Allergies: Benadryl (alcohol free) 25 mg every 6 hours as needed Breath right strips Claritin Cepacol throat lozenges Chloraseptic throat spray Cold-Eeze- up to three times per day Cough drops, alcohol free Flonase (by prescription only) Guaifenesin Mucinex Robitussin DM (plain only, alcohol free) Saline nasal spray/drops Sudafed (pseudoephedrine) & Actifed ** use only after [redacted] weeks gestation and if you do not have high blood pressure Tylenol Vicks Vaporub Zinc lozenges Zyrtec   Constipation: Colace Ducolax suppositories Fleet enema Glycerin suppositories Metamucil Milk of magnesia Miralax Senokot Smooth move tea  Diarrhea: Kaopectate Imodium A-D  *NO pepto Bismol  Hemorrhoids: Anusol Anusol HC Preparation H Tucks  Indigestion: Tums Maalox Mylanta Zantac  Pepcid  Insomnia: Benadryl (alcohol free) 25mg  every 6 hours as needed Tylenol PM Unisom, no Gelcaps  Leg Cramps: Tums MagGel  Nausea/Vomiting:  Bonine Dramamine Emetrol Ginger extract Sea bands Meclizine  Nausea medication to take during pregnancy:  Unisom (doxylamine succinate 25 mg tablets) Take one tablet daily at bedtime. If symptoms are not adequately controlled, the dose can be increased to a maximum recommended dose of two tablets daily (1/2 tablet in the morning, 1/2 tablet mid-afternoon and one at bedtime). Vitamin B6 100mg  tablets. Take one tablet twice a day (up to 200 mg per day).  Skin Rashes: Aveeno products Benadryl cream or 25mg  every 6 hours as needed Calamine Lotion 1% cortisone cream  Yeast infection: Gyne-lotrimin 7 Monistat 7   **If taking multiple medications, please check labels to avoid duplicating the same active ingredients **take medication as directed on  the label ** Do not exceed 3000 mg of tylenol in 24 hours **Do not take medications that contain aspirin or ibuprofen

## 2023-04-20 NOTE — ED Notes (Addendum)
Spoke with OB Rapid response Erin. Erin notifying [facility]U of pt Pt to be transported to [facility]U due to uterine irritablity. Dr Connye Burkitt is accepting. EDP Dr Wallace Cullens notified Pt is aware of pending transport. [facility] to be called by Diplomatic Services operational officer

## 2023-04-20 NOTE — ED Notes (Signed)
OB Rapid response notified

## 2023-04-20 NOTE — ED Provider Notes (Signed)
[facility] Provider Note  CSN: [id number] Arrival date & time: 04/20/23 2841  Chief Complaint(s) Chest Pain  HPI Gloria Estes is a 27 y.o. female with past medical history as below, significant for approximately [redacted] weeks gestation, G5 P1-1-2-2 follows with [facility], placenta previa, prior C-section who presents to the ED with complaint of cough, URI symptoms.   Patient reports she has been symptomatic for proxy 1.5 weeks, she has postnasal drip, congestion, coughing occasionally green-colored or blood-tinged sputum.  No significant dyspnea, no fevers or chills.  She has been taking over-the-counter Tylenol as needed for discomfort, last dose of Tylenol was earlier this morning.  No abdominal pain, pelvic pain, no cramping, contractions, rush of fluid or recurrent bleeding.  She is having good fetal movement.  She did have an episode of vaginal bleeding on Saturday x 1, has not reoccurred.  There is no pain associated with this.  Uncomplicated pregnancy up until this point     Past Medical History History reviewed. No pertinent past medical history. Patient Active Problem List   Diagnosis Date Noted   Previous cesarean delivery, antepartum 01/11/2023   Placenta previa antepartum in second trimester 01/11/2023   Preterm premature rupture of membranes 04/29/2021   Previous child with cardiac abnormality, antepartum, second trimester 02/11/2021   Home Medication(s) Prior to Admission medications   Medication Sig Start Date End Date Taking? Authorizing Provider  Prenatal 28-0.8 MG TABS Take 1 tablet by mouth daily.   Yes [provider]  progesterone 200 MG SUPP Place 200 mg vaginally at bedtime.   Yes [provider]  acetaminophen (TYLENOL) 500 MG tablet Take 2 tablets (1,000 mg total) by mouth every 8 (eight) hours as needed. Patient not taking: Reported on 02/10/2023 05/06/21   Gerald Leitz, MD  doxylamine, Sleep, (UNISOM) 25 MG tablet Take  25 mg by mouth at bedtime as needed. Patient not taking: Reported on 02/10/2023    [provider]  FLOVENT HFA 220 MCG/ACT inhaler Inhale 2 puffs into the lungs 2 (two) times daily. Needs a refill on this Patient not taking: Reported on 02/10/2023 11/10/20   [provider]  fluticasone (FLONASE) 50 MCG/ACT nasal spray Place 1 spray into both nostrils daily. Patient not taking: Reported on 02/10/2023 09/06/22   Carlisle Beers, FNP  ondansetron (ZOFRAN-ODT) 4 MG disintegrating tablet Take 4 mg by mouth every 8 (eight) hours as needed for nausea or vomiting. Patient not taking: Reported on 02/10/2023    [provider]  pyridOXINE (B-6) 50 MG tablet Take 50 mg by mouth daily. Patient not taking: Reported on 02/10/2023    [provider]  albuterol (PROVENTIL HFA;VENTOLIN HFA) 108 (90 Base) MCG/ACT inhaler Inhale 1 puff into the lungs every 6 (six) hours as needed for wheezing or shortness of breath.  03/20/19  [provider]  ferrous fumarate (HEMOCYTE - 106 MG FE) 325 (106 FE) MG TABS tablet Take 1 tablet by mouth daily.   03/20/19  [provider]  medroxyPROGESTERone (DEPO-PROVERA) 150 MG/ML injection Inject 150 mg into the muscle every 3 (three) months. 08/02/19 01/30/20  [provider]  Past Surgical History Past Surgical History:  Procedure Laterality Date   CESAREAN SECTION N/A 05/03/2021   Procedure: CESAREAN SECTION;  Surgeon: Gerald Leitz, MD;  Location: [facility]S;  Service: Obstetrics;  Laterality: N/A;  Primary C/S PPROM Breech   DILATION AND CURETTAGE OF UTERUS     Family History Family History  Problem Relation Age of Onset   Healthy Mother    Healthy Father     Social History Social History   Tobacco Use   Smoking status: Never   Smokeless tobacco: Never  Vaping Use   Vaping  status: Never Used  Substance Use Topics   Alcohol use: Not Currently    Comment: social   Drug use: Never   Allergies Tape  Review of Systems Review of Systems  Constitutional:  Negative for chills and fever.  HENT:  Positive for congestion and postnasal drip.   Respiratory:  Positive for cough.   Gastrointestinal:  Negative for abdominal pain, nausea and vomiting.  Genitourinary:  Positive for vaginal bleeding. Negative for pelvic pain and vaginal pain.  Neurological:  Negative for light-headedness and numbness.  All other systems reviewed and are negative.   Physical Exam Vital Signs  I have reviewed the triage vital signs BP (!) 102/50 (BP Location: Left Arm)   Pulse 79   Temp 98 F (36.7 C) (Oral)   Resp 19   Ht 5\' 9"  (1.753 m)   Wt 78.9 kg   LMP 08/07/2022   SpO2 100%   BMI 25.69 kg/m  Physical Exam Vitals and nursing note reviewed.  Constitutional:      General: She is not in acute distress.    Appearance: Normal appearance.  HENT:     Head: Normocephalic and atraumatic.     Right Ear: External ear normal.     Left Ear: External ear normal.     Nose: Nose normal.     Mouth/Throat:     Mouth: Mucous membranes are moist.  Eyes:     General: No scleral icterus.       Right eye: No discharge.        Left eye: No discharge.  Cardiovascular:     Rate and Rhythm: Regular rhythm. Tachycardia present.     Pulses: Normal pulses.     Heart sounds: Normal heart sounds.  Pulmonary:     Effort: Pulmonary effort is normal. No respiratory distress.     Breath sounds: Normal breath sounds. No stridor.  Abdominal:     General: Abdomen is flat. There is no distension.     Palpations: Abdomen is soft.     Tenderness: There is no abdominal tenderness.     Comments: gravid  Musculoskeletal:     Cervical back: No rigidity.     Right lower leg: No edema.     Left lower leg: No edema.  Skin:    General: Skin is warm and dry.     Capillary Refill: Capillary refill  takes less than 2 seconds.  Neurological:     Mental Status: She is alert.  Psychiatric:        Mood and Affect: Mood normal.        Behavior: Behavior normal. Behavior is cooperative.     ED Results and Treatments Labs (all labs ordered are listed, but only abnormal results are displayed) Labs Reviewed  CBC WITH DIFFERENTIAL/PLATELET - Abnormal; Notable for the following components:      Result Value   RBC 3.26 (*)  Hemoglobin 10.0 (*)    HCT 30.2 (*)    Neutro Abs 7.8 (*)    Abs Immature Granulocytes 0.12 (*)    All other components within normal limits  COMPREHENSIVE METABOLIC PANEL - Abnormal; Notable for the following components:   Sodium 131 (*)    CO2 20 (*)    Calcium 8.4 (*)    Albumin 2.6 (*)    All other components within normal limits  RESP PANEL BY RT-PCR (RSV, FLU A&B, COVID)  RVPGX2  RESPIRATORY PANEL BY PCR  URINALYSIS, ROUTINE W REFLEX MICROSCOPIC                                                                                                                          Radiology DG CHEST PORT 1 VIEW Result Date: 04/20/2023 CLINICAL DATA:  Chest pain and cough. EXAM: PORTABLE CHEST 1 VIEW COMPARISON:  05/29/2020. FINDINGS: Trachea is midline. Heart size is accentuated by AP technique and somewhat low lung volumes. Lungs are clear. No pleural fluid. IMPRESSION: No acute findings. Electronically Signed   By: Leanna Battles M.D.   On: 04/20/2023 15:31   [place] MFM FETAL BPP WO NON STRESS Result Date: 04/20/2023 ----------------------------------------------------------------------  OBSTETRICS REPORT                       (Signed Final 04/20/2023 02:14 pm) ---------------------------------------------------------------------- Patient Info  ID #:       [id number]                          D.O.B.:  [birth year] (27 yrs)(F)  Name:       Gloria Estes                  Visit Date: 04/20/2023 01:23 pm ---------------------------------------------------------------------- Performed  By  Attending:        Noralee Space MD        Ref. Address:     [facility]                                                             [address]  [place], [place]                                                             [address]  Performed By:     Anabel Halon          [facility]  Referred By:      Gerald Leitz MD ---------------------------------------------------------------------- Orders  #  Description                           Code        Ordered By  1  [place] MFM FETAL BPP WO NON               84166.06    TARA COLE     STRESS ----------------------------------------------------------------------  #  Order #                     Accession #                Episode #  1  [id number]                   [id number]                 [id number] ---------------------------------------------------------------------- Indications  Non-reactive NST                               O28.9  [redacted] weeks gestation of pregnancy                Z3A.33  Low lying placenta, antepartum (resolved)      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 Cord Insertion:      Previously seen  Amniotic Fluid  AFI FV:      Within normal limits  AFI Sum(cm)     %Tile       Largest Pocket(cm)  15              53          6  RUQ(cm)       RLQ(cm)       LUQ(cm)        LLQ(cm)  4  3.2           6              1.8 ---------------------------------------------------------------------- Biophysical Evaluation  Amniotic F.V:   Pocket => 2 cm             F. Tone:        Observed  F. Movement:    Observed                   Score:          8/8  F. Breathing:   Observed ---------------------------------------------------------------------- OB History  Gravidity:    5         Term:   1        Prem:   1  TOP:          2        Living:  2 ---------------------------------------------------------------------- Gestational Age  LMP:           36w 4d        Date:  08/07/22                   EDD:   05/14/23  Best:          33w 2d     Det. By:  Early Exam  (10/19/22)   EDD:   06/06/23 ---------------------------------------------------------------------- Impression  Amniotic fluid is normal and good fetal activity is seen  .Antenatal testing is reassuring. BPP 8/8. ----------------------------------------------------------------------                  Noralee Space, MD Electronically Signed Final Report   04/20/2023 02:14 pm ----------------------------------------------------------------------    Pertinent labs & imaging results that were available during my care of the patient were reviewed by me and considered in my medical decision making (see MDM for details).  Medications Ordered in ED Medications  NIFEdipine (PROCARDIA) capsule 10 mg (10 mg Oral Not Given 04/20/23 1506)  sodium chloride 0.9 % bolus 1,000 mL (0 mLs Intravenous Stopped 04/20/23 1151)  cyclobenzaprine (FLEXERIL) tablet 10 mg (10 mg Oral Given 04/20/23 1506)                                                                                                                                     Procedures Procedures  (including critical care time)  Medical Decision Making / ED Course    Medical Decision Making:    JAELENE DECH is a 27 y.o. female with past medical history as below, significant for approximately [redacted] weeks  gestation, G5 P1-1-2-2 follows with [facility], placenta previa, prior C-section who presents to the ED with complaint of cough, URI symptoms.. The complaint involves an extensive differential diagnosis and also carries with it a high risk of complications and morbidity.  Serious etiology was considered. Ddx includes but is not limited to: Viral syndrome, pneumonia, PE although less likely, pregnancy complication,  etc   Complete initial physical exam performed, notably the patient was in no acute stress, no hypoxia, tachycardia has improved.    Reviewed and confirmed nursing documentation for past medical history, family history, social history.  Vital signs reviewed.      Clinical Course as of 04/20/23 1603  Wed Apr 20, 2023  1112 OB rapid response requesting transfer to MAU [SG]  1200 Sodium(!): 131 give IV fluids [SG]    Clinical Course User Index [SG] Sloan Leiter, DO    Brief summary: 27 year old female approximately [redacted] weeks gestation here with URI symptoms including cough, postnasal drip, congestion.  She was seen in urgent care initially and sent to the ER for evaluation given elevated heart rate.  She has no hypoxia.  Heart has improved while in triage.  Will collect screening labs, give fluids, patient reports she had some bleeding approximately 4 days ago which has since resolved.  Will get ultrasound.  Placed on fetal heart monitoring.  Received notification from OB rapid response to the requesting patient recent transfer to MAU, she is agreeable.  Will arrange transfer                Additional history obtained: -Additional history obtained from na -External records from outside source obtained and reviewed including: Chart review including previous notes, labs, imaging, consultation notes including  Primary care documentation, prior admission, prior urgent care documentation   Lab Tests: -I ordered, reviewed, and interpreted labs.   The pertinent results  include:   Labs Reviewed  CBC WITH DIFFERENTIAL/PLATELET - Abnormal; Notable for the following components:      Result Value   RBC 3.26 (*)    Hemoglobin 10.0 (*)    HCT 30.2 (*)    Neutro Abs 7.8 (*)    Abs Immature Granulocytes 0.12 (*)    All other components within normal limits  COMPREHENSIVE METABOLIC PANEL - Abnormal; Notable for the following components:   Sodium 131 (*)    CO2 20 (*)    Calcium 8.4 (*)    Albumin 2.6 (*)    All other components within normal limits  RESP PANEL BY RT-PCR (RSV, FLU A&B, COVID)  RVPGX2  RESPIRATORY PANEL BY PCR  URINALYSIS, ROUTINE W REFLEX MICROSCOPIC    Notable for labs stable  EKG   EKG Interpretation Date/Time:  Wednesday April 20 2023 10:12:28 EST Ventricular Rate:  105 PR Interval:  160 QRS Duration:  71 QT Interval:  296 QTC Calculation: 392 R Axis:   67  Text Interpretation: Sinus tachycardia Right atrial enlargement Borderline T abnormalities, anterior leads similar to prior 1/22 Confirmed by Tanda Rockers ([id number]) on 04/20/2023 10:31:19 AM         Imaging Studies ordered: Ordered ultrasound initially, pt transfer prior to this being completed, did not want to delay care  Medicines ordered and prescription drug management: Meds ordered this encounter  Medications   sodium chloride 0.9 % bolus 1,000 mL   NIFEdipine (PROCARDIA) capsule 10 mg   cyclobenzaprine (FLEXERIL) tablet 10 mg    -I have reviewed the patients home medicines and have made adjustments as needed   Consultations Obtained: I requested consultation with the OB RR,     Cardiac Monitoring: The patient was maintained on a cardiac monitor.  I personally viewed and interpreted the cardiac monitored which showed an underlying rhythm of: sinus tachy > NSR Continuous pulse oximetry interpreted by myself, 99% on RA.    Social Determinants of Health:  Diagnosis  or treatment significantly limited by social determinants of health:  pregnant   Reevaluation: After the interventions noted above, I reevaluated the patient and found that they have improved  Co morbidities that complicate the patient evaluation History reviewed. No pertinent past medical history.    Dispostion: Disposition decision including need for hospitalization was considered, and patient transferred.    Final Clinical Impression(s) / ED Diagnoses Final diagnoses:  URI with cough and congestion  Pleuritic chest pain  [redacted] weeks gestation of pregnancy        Sloan Leiter, DO 04/20/23 1603

## 2023-04-20 NOTE — ED Provider Notes (Signed)
[name]    CSN: [id number] Arrival date & time: 04/20/23  4098      History   Chief Complaint No chief complaint on file.   HPI Gloria Estes is a 27 y.o. female.   HPI  Cough for couple weeks, admits sputum which is green at times but now is bloody.  Admits pain in her chest when she coughs and shortness of breath.  Has nasal congestion.  She is currently [redacted] weeks pregnant History reviewed. No pertinent past medical history.  Patient Active Problem List   Diagnosis Date Noted   Previous cesarean delivery, antepartum 01/11/2023   Placenta previa antepartum in second trimester 01/11/2023   Preterm premature rupture of membranes 04/29/2021   Previous child with cardiac abnormality, antepartum, second trimester 02/11/2021    Past Surgical History:  Procedure Laterality Date   CESAREAN SECTION N/A 05/03/2021   Procedure: CESAREAN SECTION;  Surgeon: Gerald Leitz, MD;  Location: [facility];  Service: Obstetrics;  Laterality: N/A;  Primary C/S PPROM Breech   DILATION AND CURETTAGE OF UTERUS      OB History     Gravida  5   Para  2   Term  1   Preterm  1   AB  2   Living  2      SAB      IAB  2   Ectopic      Multiple  0   Live Births  2            Home Medications    Prior to Admission medications   Medication Sig Start Date End Date Taking? Authorizing Provider  acetaminophen (TYLENOL) 500 MG tablet Take 2 tablets (1,000 mg total) by mouth every 8 (eight) hours as needed. Patient not taking: Reported on 02/10/2023 05/06/21   Gerald Leitz, MD  doxylamine, Sleep, (UNISOM) 25 MG tablet Take 25 mg by mouth at bedtime as needed. Patient not taking: Reported on 02/10/2023    [provider]  FLOVENT HFA 220 MCG/ACT inhaler Inhale 2 puffs into the lungs 2 (two) times daily. Needs a refill on this Patient not taking: Reported on 02/10/2023 11/10/20   [provider]  fluticasone (FLONASE) 50 MCG/ACT nasal spray Place 1 spray  into both nostrils daily. Patient not taking: Reported on 02/10/2023 09/06/22   Carlisle Beers, FNP  ondansetron (ZOFRAN-ODT) 4 MG disintegrating tablet Take 4 mg by mouth every 8 (eight) hours as needed for nausea or vomiting. Patient not taking: Reported on 02/10/2023    [provider]  Prenatal 28-0.8 MG TABS Take 1 tablet by mouth daily.    [provider]  progesterone 200 MG SUPP Place 200 mg vaginally at bedtime.    [provider]  pyridOXINE (B-6) 50 MG tablet Take 50 mg by mouth daily. Patient not taking: Reported on 02/10/2023    [provider]  albuterol (PROVENTIL HFA;VENTOLIN HFA) 108 (90 Base) MCG/ACT inhaler Inhale 1 puff into the lungs every 6 (six) hours as needed for wheezing or shortness of breath.  03/20/19  [provider]  ferrous fumarate (HEMOCYTE - 106 MG FE) 325 (106 FE) MG TABS tablet Take 1 tablet by mouth daily.   03/20/19  [provider]  medroxyPROGESTERone (DEPO-PROVERA) 150 MG/ML injection Inject 150 mg into the muscle every 3 (three) months. 08/02/19 01/30/20  [provider]    Family History Family History  Problem Relation Age of Onset   Healthy Mother  Healthy Father     Social History Social History   Tobacco Use   Smoking status: Never   Smokeless tobacco: Never  Vaping Use   Vaping status: Never Used  Substance Use Topics   Alcohol use: Not Currently    Comment: social   Drug use: Never     Allergies   Tape   Review of Systems Review of Systems  Constitutional:  Positive for fatigue and fever.  Respiratory:  Positive for cough and shortness of breath. Negative for wheezing.   Cardiovascular:  Positive for chest pain.  Gastrointestinal:  Negative for nausea and vomiting.  Genitourinary:  Negative for vaginal bleeding.  Musculoskeletal:  Positive for myalgias.     Physical Exam Triage Vital Signs ED Triage Vitals  Encounter Vitals Group     BP 04/20/23  0902 99/67     Systolic BP Percentile --      Diastolic BP Percentile --      Pulse Rate 04/20/23 0902 (!) 130     Resp 04/20/23 0902 16     Temp 04/20/23 0902 98.7 F (37.1 C)     Temp Source 04/20/23 0902 Oral     SpO2 04/20/23 0902 100 %     Weight 04/20/23 0901 174 lb (78.9 kg)     Height 04/20/23 0901 5\' 9"  (1.753 m)     Head Circumference --      Peak Flow --      Pain Score 04/20/23 0901 7     Pain Loc --      Pain Education --      Exclude from Growth Chart --    No data found.  Updated Vital Signs BP 99/67 (BP Location: Left Arm)   Pulse (!) 130   Temp 98.7 F (37.1 C) (Oral)   Resp 16   Ht 5\' 9"  (1.753 m)   Wt 174 lb (78.9 kg)   LMP 08/07/2022   SpO2 100%   Breastfeeding No   BMI 25.70 kg/m   Visual Acuity Right Eye Distance:   Left Eye Distance:   Bilateral Distance:    Right Eye Near:   Left Eye Near:    Bilateral Near:     Physical Exam Constitutional:      Appearance: She is not ill-appearing or diaphoretic.  HENT:     Head: Normocephalic.  Cardiovascular:     Rate and Rhythm: Tachycardia present.     Heart sounds: Normal heart sounds.  Pulmonary:     Effort: Pulmonary effort is normal. No respiratory distress.     Breath sounds: No rhonchi.  Abdominal:     Comments: Gravid abdomen  Musculoskeletal:     Cervical back: Neck supple.  Skin:    General: Skin is warm and dry.  Neurological:     Mental Status: She is alert and oriented to person, place, and time.      UC Treatments / Results  Labs (all labs ordered are listed, but only abnormal results are displayed) Labs Reviewed - No data to display  EKG   Radiology No results found.  Procedures Procedures (including critical care time)  Medications Ordered in UC Medications - No data to display  Initial Impression / Assessment and Plan / UC Course  I have reviewed the triage vital signs and the nursing notes.  Pertinent labs & imaging results that were available during  my care of the patient were reviewed by me and considered in my medical decision making (see chart  for details).    27 year old female currently pregnant presents with cough with some hemoptysis and green sputum.  Admits shortness of breath.  Her resting heart rate is 130 discussed with patient concern for pneumonia versus pulmonary embolism recommend urgent ED evaluation.  She will drive herself to the ED, was stable upon discharge Final Clinical Impressions(s) / UC Diagnoses   Final diagnoses:  None   Discharge Instructions   None    ED Prescriptions   None    PDMP not reviewed this encounter.   Meliton Rattan, [place] 04/20/23 [phone]

## 2023-04-20 NOTE — MAU Note (Signed)
Pt to MAU from [facility] via EMS, c/o chest congestions and needing monitoring EFM - no c/o chest pain when not coughing, HA, srom, contractions, vaginal bleeding and pt reports fetal movement. EFM explained and applied to soft non tender abd.

## 2023-04-20 NOTE — ED Notes (Signed)
Pt reports cough and congestion for 1 1/2 weeks. Sent here by OB. Pt is moving. Denies any abdominal pain at this time. OB Rapid Response notified

## 2023-04-20 NOTE — ED Notes (Signed)
Patient is being discharged from the Urgent Care and sent to the Emergency Department via car . Per A. Acevedo, PA patient is in need of higher level of care due to bloody mucus and elevated heart rate. Patient is aware and verbalizes understanding of plan of care.  Vitals:   04/20/23 0902  BP: 99/67  Pulse: (!) 130  Resp: 16  Temp: 98.7 F (37.1 C)  SpO2: 100%

## 2023-04-20 NOTE — Progress Notes (Signed)
RROB called [facility] and asked them to adjust fhr monitor.

## 2023-04-20 NOTE — ED Triage Notes (Signed)
Patient presents with cough, bloody mucus, vomiting, cold sweats, body aches and fatigue. Treated with Tylenol and Mucinex without relief.

## 2023-04-20 NOTE — ED Notes (Signed)
Pt reports not eating this am. [facility] nurse recommends giving pt a snack. Provided pt with crackers and soda

## 2023-04-20 NOTE — Progress Notes (Signed)
Dr Connye Burkitt called and notified of pt.  This patient is actually a Dr Richardson Dopp pt and Dr Richardson Dopp was present for phone call.  MD made aware of vitals and reassuring but not reactive NST.  Dr Richardson Dopp also told about the UI and pts history of preterm delivery.  MD gives orders for transfer of pt to MAU and a BPP upon arrival.  MD also encourages pt to eat if she hasn't already today.  [facility] agrees to feed her while waiting on transport.  RROB gives report to MAU.

## 2023-04-20 NOTE — ED Notes (Signed)
[facility] into transport 

## 2023-04-20 NOTE — ED Triage Notes (Signed)
Pt 33 week OB pt that started to have cp x 1 1/2 weeks ago  went to UC and was sent here for fast HR and bp was low ?  Pneu or blood clot  , sputum has been bloody she states  P 5 G5 A0 L2   last child 2023 born at 53 weeks

## 2023-04-20 NOTE — MAU Provider Note (Signed)
MAU Provider Note  Chief Complaint: Chest Pain   Provider seen 1316     SUBJECTIVE HPI: Gloria Estes is a 27 y.o. [id number] at [redacted]w[redacted]d by early ultrasound who presents to maternity admissions reporting cough. Pregnancy c/b hx prior C/S, resolved placenta previa. Receives [facility] with [facility].  Patient presents with cough and congestion. Multiple sick contacts. Has chest pain only when coughing. Denies fever/chills, tachycardia, N/V, urinary symptoms, LOF. +FM. Did have vaginal spotting on Saturday.  Initially seen at [facility] then transferred to [facility] ER. CBC, CMP, RSV/COVID/flu, EKG obtained there. Mild anemia and left shift. Adele Schilder was found to be non-reactive, so transferred to MAU. Diagnosed with URI.  HPI  History reviewed. No pertinent past medical history. Past Surgical History:  Procedure Laterality Date   CESAREAN SECTION N/A 05/03/2021   Procedure: CESAREAN SECTION;  Surgeon: Gerald Leitz, MD;  Location: [facility] LD ORS;  Service: Obstetrics;  Laterality: N/A;  Primary C/S PPROM Breech   DILATION AND CURETTAGE OF UTERUS     Social History   Socioeconomic History   Marital status: Single    Spouse name: Not on file   Number of children: Not on file   Years of education: Not on file   Highest education level: Not on file  Occupational History   Not on file  Tobacco Use   Smoking status: Never   Smokeless tobacco: Never  Vaping Use   Vaping status: Never Used  Substance and Sexual Activity   Alcohol use: Not Currently    Comment: social   Drug use: Never   Sexual activity: Yes    Birth control/protection: None  Other Topics Concern   Not on file  Social History Narrative   Not on file   Social Drivers of Health   Financial Resource Strain: Not on file  Food Insecurity: Not on file  Transportation Needs: Not on file  Physical Activity: Not on file  Stress: Not on file  Social Connections: Not on file  Intimate Partner Violence: Not on file   No current  facility-administered medications on file prior to encounter.   Current Outpatient Medications on File Prior to Encounter  Medication Sig Dispense Refill   Prenatal 28-0.8 MG TABS Take 1 tablet by mouth daily.     progesterone 200 MG SUPP Place 200 mg vaginally at bedtime.     acetaminophen (TYLENOL) 500 MG tablet Take 2 tablets (1,000 mg total) by mouth every 8 (eight) hours as needed. (Patient not taking: Reported on 02/10/2023) 30 tablet 0   doxylamine, Sleep, (UNISOM) 25 MG tablet Take 25 mg by mouth at bedtime as needed. (Patient not taking: Reported on 02/10/2023)     FLOVENT HFA 220 MCG/ACT inhaler Inhale 2 puffs into the lungs 2 (two) times daily. Needs a refill on this (Patient not taking: Reported on 02/10/2023)     fluticasone (FLONASE) 50 MCG/ACT nasal spray Place 1 spray into both nostrils daily. (Patient not taking: Reported on 02/10/2023) 11.1 mL 2   ondansetron (ZOFRAN-ODT) 4 MG disintegrating tablet Take 4 mg by mouth every 8 (eight) hours as needed for nausea or vomiting. (Patient not taking: Reported on 02/10/2023)     pyridOXINE (B-6) 50 MG tablet Take 50 mg by mouth daily. (Patient not taking: Reported on 02/10/2023)     [DISCONTINUED] albuterol (PROVENTIL HFA;VENTOLIN HFA) 108 (90 Base) MCG/ACT inhaler Inhale 1 puff into the lungs every 6 (six) hours as needed for wheezing or shortness of breath.     [  DISCONTINUED] ferrous fumarate (HEMOCYTE - 106 MG FE) 325 (106 FE) MG TABS tablet Take 1 tablet by mouth daily.      [DISCONTINUED] medroxyPROGESTERone (DEPO-PROVERA) 150 MG/ML injection Inject 150 mg into the muscle every 3 (three) months.     Allergies  Allergen Reactions   Tape Itching and Other (See Comments)    ROS:  Pertinent positives/negatives listed above.  I have reviewed patient's Past Medical Hx, Surgical Hx, Family Hx, Social Hx, medications and allergies.   Physical Exam  Patient Vitals for the past 24 hrs:  BP Temp Temp src Pulse Resp SpO2 Height Weight   04/20/23 1506 (!) 110/58 -- -- 90 -- -- -- --  04/20/23 1328 118/61 -- -- 91 16 100 % -- --  04/20/23 1308 (!) 107/54 98.5 F (36.9 C) Oral 79 18 -- -- --  04/20/23 1121 114/63 98.1 F (36.7 C) Oral 93 19 100 % -- --  04/20/23 1016 123/75 98.1 F (36.7 C) Oral (!) 113 (!) 23 100 % -- --  04/20/23 1015 -- -- -- -- -- -- 5\' 9"  (1.753 m) 78.9 kg   Constitutional: Well-developed, well-nourished female in no acute distress  Cardiovascular: normal rate, regular rhythm, no m/r/g Respiratory: normal effort, CTAB GI: Abd soft, non-tender, gravid MS: Extremities nontender, no edema, normal ROM Neurologic: Alert and oriented x 4   FHT:  Baseline 135 , moderate variability, 10x10 accelerations present, no decelerations Contractions: irregular  LAB RESULTS Results for orders placed or performed during the hospital encounter of 04/20/23 (from the past 24 hours)  CBC with Differential     Status: Abnormal   Collection Time: 04/20/23 10:33 AM  Result Value Ref Range   WBC 10.2 4.0 - 10.5 K/uL   RBC 3.26 (L) 3.87 - 5.11 MIL/uL   Hemoglobin 10.0 (L) 12.0 - 15.0 g/dL   HCT 65.7 (L) 84.6 - 96.2 %   MCV 92.6 80.0 - 100.0 fL   MCH 30.7 26.0 - 34.0 pg   MCHC 33.1 30.0 - 36.0 g/dL   RDW 95.2 84.1 - 32.4 %   Platelets 227 150 - 400 K/uL   nRBC 0.0 0.0 - 0.2 %   Neutrophils Relative % 76 %   Neutro Abs 7.8 (H) 1.7 - 7.7 K/uL   Lymphocytes Relative 13 %   Lymphs Abs 1.3 0.7 - 4.0 K/uL   Monocytes Relative 9 %   Monocytes Absolute 0.9 0.1 - 1.0 K/uL   Eosinophils Relative 1 %   Eosinophils Absolute 0.1 0.0 - 0.5 K/uL   Basophils Relative 0 %   Basophils Absolute 0.0 0.0 - 0.1 K/uL   Immature Granulocytes 1 %   Abs Immature Granulocytes 0.12 (H) 0.00 - 0.07 K/uL  Comprehensive metabolic panel     Status: Abnormal   Collection Time: 04/20/23 10:33 AM  Result Value Ref Range   Sodium 131 (L) 135 - 145 mmol/L   Potassium 3.9 3.5 - 5.1 mmol/L   Chloride 105 98 - 111 mmol/L   CO2 20 (L) 22 - 32  mmol/L   Glucose, Bld 85 70 - 99 mg/dL   BUN 10 6 - 20 mg/dL   Creatinine, Ser 4.01 0.44 - 1.00 mg/dL   Calcium 8.4 (L) 8.9 - 10.3 mg/dL   Total Protein 6.7 6.5 - 8.1 g/dL   Albumin 2.6 (L) 3.5 - 5.0 g/dL   AST 17 15 - 41 U/L   ALT 11 0 - 44 U/L   Alkaline Phosphatase 90 38 -  126 U/L   Total Bilirubin 0.3 <1.2 mg/dL   GFR, Estimated >62 >95 mL/min   Anion gap 6 5 - 15  Resp panel by RT-PCR (RSV, Flu A&B, Covid) Anterior Nasal Swab     Status: None   Collection Time: 04/20/23 10:33 AM   Specimen: Anterior Nasal Swab  Result Value Ref Range   SARS Coronavirus 2 by RT PCR NEGATIVE NEGATIVE   Influenza A by PCR NEGATIVE NEGATIVE   Influenza B by PCR NEGATIVE NEGATIVE   Resp Syncytial Virus by PCR NEGATIVE NEGATIVE  Urinalysis, Routine w reflex microscopic -Urine, Clean Catch     Status: None   Collection Time: 04/20/23  1:13 PM  Result Value Ref Range   Color, Urine YELLOW YELLOW   APPearance CLEAR CLEAR   Specific Gravity, Urine 1.008 1.005 - 1.030   pH 7.0 5.0 - 8.0   Glucose, UA NEGATIVE NEGATIVE mg/dL   Hgb urine dipstick NEGATIVE NEGATIVE   Bilirubin Urine NEGATIVE NEGATIVE   Ketones, ur NEGATIVE NEGATIVE mg/dL   Protein, ur NEGATIVE NEGATIVE mg/dL   Nitrite NEGATIVE NEGATIVE   Leukocytes,Ua NEGATIVE NEGATIVE       IMAGING DG CHEST PORT 1 VIEW Result Date: 04/20/2023 CLINICAL DATA:  Chest pain and cough. EXAM: PORTABLE CHEST 1 VIEW COMPARISON:  05/29/2020. FINDINGS: Trachea is midline. Heart size is accentuated by AP technique and somewhat low lung volumes. Lungs are clear. No pleural fluid. IMPRESSION: No acute findings. Electronically Signed   By: Leanna Battles M.D.   On: 04/20/2023 15:31   [place] MFM FETAL BPP WO NON STRESS Result Date: 04/20/2023 ----------------------------------------------------------------------  OBSTETRICS REPORT                       (Signed Final 04/20/2023 02:14 pm) ---------------------------------------------------------------------- Patient  Info  ID #:       [id number]                          D.O.B.:  07/05/1995 (27 yrs)(F)  Name:       Gloria Estes                  Visit Date: 04/20/2023 01:23 pm ---------------------------------------------------------------------- Performed By  Attending:        Noralee Space MD        Ref. Address:     [facility]                                                             [address]  [place], [place]                                                             [address]  Performed By:     Anabel Halon          [facility]  Referred By:      Gerald Leitz MD ---------------------------------------------------------------------- Orders  #  Description                           Code        Ordered By  1  [place] MFM FETAL BPP WO NON               60454.09    TARA COLE     STRESS ----------------------------------------------------------------------  #  Order #                     Accession #                Episode #  1  [id number]                   [id number]                 [id number] ---------------------------------------------------------------------- Indications  Non-reactive NST                               O28.9  [redacted] weeks gestation of pregnancy                Z3A.33  Low lying placenta, antepartum (resolved)      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 Cord Insertion:      Previously seen  Amniotic Fluid  AFI FV:      Within normal limits  AFI Sum(cm)     %Tile       Largest Pocket(cm)  15              53          6  RUQ(cm)       RLQ(cm)       LUQ(cm)        LLQ(cm)  4             3.2           6              1.8 ---------------------------------------------------------------------- Biophysical Evaluation  Amniotic F.V:   Pocket => 2 cm             F. Tone:        Observed  F. Movement:    Observed                   Score:          8/8  F. Breathing:   Observed ---------------------------------------------------------------------- OB History  Gravidity:    5         Term:   1        Prem:   1  TOP:          2        Living:  2 ---------------------------------------------------------------------- Gestational Age  LMP:           36w 4d        Date:  08/07/22                   EDD:   05/14/23  Best:          33w 2d     Det. By:  Early Exam  (10/19/22)   EDD:   06/06/23 ---------------------------------------------------------------------- Impression  Amniotic fluid is normal and good fetal activity is seen  .Antenatal testing is reassuring. BPP 8/8. ----------------------------------------------------------------------                  Noralee Space, MD Electronically Signed Final Report   04/20/2023 02:14 pm ----------------------------------------------------------------------    MAU Management/MDM: Orders Placed This Encounter  Procedures   Resp panel by RT-PCR (RSV, Flu A&B, Covid) Anterior Nasal Swab   Respiratory (~20 pathogens) panel by PCR   DG CHEST PORT 1 VIEW   [place] MFM FETAL BPP WO NON STRESS   CBC with Differential   Comprehensive metabolic panel   Urinalysis, Routine w reflex microscopic -Urine, Clean Catch   EKG 12-Lead   Discharge patient    Meds ordered this encounter  Medications   sodium chloride 0.9 % bolus 1,000 mL   NIFEdipine (PROCARDIA) capsule 10 mg   cyclobenzaprine (FLEXERIL) tablet 10 mg     Available prenatal  records reviewed.  Patient presents with cough and chest pain from OSH. Babe found to be nonreactive and transferred to MAU for further evaluation.  #Pleuritic CP: only while coughing. Initial viral panel negative. Exam and CXR not consistent with pneumonia/bacterial cause. Additionally was tachycardic at OSH but this has resolved. No hypoxia. No swelling/signs of DVT on lower extremities. I do not suspect PE at this time. Do suspect viral illness.  #FWB: BPP 8/8 and patient notes good FM. FHT reassuring.  #Preterm contractions: After arrival, patient started contracting and feeling them mildly. Flexeril given and PO hydration. Cervix reassuringly closed and closed on recheck. Reassuring against preterm labor. Did give strict return precautions given history of prior 33 week delivery.  ASSESSMENT 1. URI with cough and congestion   2. Non-reactive NST (non-stress test)   3. Pleuritic chest pain   4. [redacted] weeks gestation of pregnancy     PLAN Discharge home with strict return precautions. Allergies as of 04/20/2023       Reactions   Tape Itching, Other (See Comments)  Medication List     STOP taking these medications    doxylamine (Sleep) 25 MG tablet Commonly known as: UNISOM   ondansetron 4 MG disintegrating tablet Commonly known as: ZOFRAN-ODT   progesterone 200 MG Supp   pyridOXINE 50 MG tablet Commonly known as: B-6       TAKE these medications    acetaminophen 500 MG tablet Commonly known as: TYLENOL Take 2 tablets (1,000 mg total) by mouth every 8 (eight) hours as needed.   Flovent HFA 220 MCG/ACT inhaler Generic drug: fluticasone Inhale 2 puffs into the lungs 2 (two) times daily. Needs a refill on this   fluticasone 50 MCG/ACT nasal spray Commonly known as: FLONASE Place 1 spray into both nostrils daily.   Prenatal 28-0.8 MG Tabs Take 1 tablet by mouth daily.         Wylene Simmer, MD OB Fellow 04/20/2023  4:00 PM

## 2023-04-26 ENCOUNTER — Inpatient Hospital Stay (HOSPITAL_COMMUNITY)
Admission: AD | Admit: 2023-04-26 | Discharge: 2023-04-26 | Disposition: A | Discharge status: Home / Self Care | Payer: 59 | Attending: Obstetrics and Gynecology | Admitting: Obstetrics and Gynecology

## 2023-04-26 ENCOUNTER — Inpatient Hospital Stay (HOSPITAL_COMMUNITY): Payer: 59

## 2023-04-26 ENCOUNTER — Encounter (HOSPITAL_COMMUNITY): Payer: Self-pay | Admitting: Obstetrics and Gynecology

## 2023-04-26 ENCOUNTER — Other Ambulatory Visit: Payer: Self-pay

## 2023-04-26 DIAGNOSIS — O4443 Low lying placenta NOS or without hemorrhage, third trimester: Secondary | ICD-10-CM | POA: Diagnosis not present

## 2023-04-26 DIAGNOSIS — O09293 Supervision of pregnancy with other poor reproductive or obstetric history, third trimester: Secondary | ICD-10-CM | POA: Diagnosis not present

## 2023-04-26 DIAGNOSIS — O368130 Decreased fetal movements, third trimester, not applicable or unspecified: Secondary | ICD-10-CM | POA: Diagnosis present

## 2023-04-26 DIAGNOSIS — R000 Tachycardia, unspecified: Secondary | ICD-10-CM | POA: Diagnosis present

## 2023-04-26 DIAGNOSIS — O479 False labor, unspecified: Secondary | ICD-10-CM | POA: Diagnosis not present

## 2023-04-26 DIAGNOSIS — O09213 Supervision of pregnancy with history of pre-term labor, third trimester: Secondary | ICD-10-CM | POA: Insufficient documentation

## 2023-04-26 DIAGNOSIS — O34219 Maternal care for unspecified type scar from previous cesarean delivery: Secondary | ICD-10-CM | POA: Diagnosis not present

## 2023-04-26 DIAGNOSIS — Z3A34 34 weeks gestation of pregnancy: Secondary | ICD-10-CM

## 2023-04-26 LAB — URINALYSIS, ROUTINE W REFLEX MICROSCOPIC
Bilirubin Urine: NEGATIVE
Glucose, UA: NEGATIVE mg/dL
Hgb urine dipstick: NEGATIVE
Ketones, ur: NEGATIVE mg/dL
Leukocytes,Ua: NEGATIVE
Nitrite: NEGATIVE
Protein, ur: NEGATIVE mg/dL
Specific Gravity, Urine: 1.02 (ref 1.005–1.030)
pH: 6 (ref 5.0–8.0)

## 2023-04-26 LAB — WET PREP, GENITAL
Clue Cells Wet Prep HPF POC: NONE SEEN
Sperm: NONE SEEN
Trich, Wet Prep: NONE SEEN
WBC, Wet Prep HPF POC: >=10 — AB (ref <10)
Yeast Wet Prep HPF POC: NONE SEEN

## 2023-04-26 NOTE — MAU Note (Signed)
Gloria Estes is a 27 y.o. at [redacted]w[redacted]d here in MAU reporting: she having decreased FM, states feeling movement but less than usual.  Reports has done 2 fetal kick counts one prior to eating and one after eating and only got 2 movements each time.  Denies VB or LOF.  LMP: NA Onset of complaint: today Pain score: 0 Vitals:   04/26/23 1333  BP: 117/74  Pulse: (!) 110  Resp: 18  Temp: 98.4 F (36.9 C)  SpO2: 100%     FHT:156 bpm Lab orders placed from triage: NA

## 2023-04-26 NOTE — MAU Provider Note (Signed)
DFM    S [name] is a 27 y.o. [phone] pregnant female at [redacted]w[redacted]d who presents to MAU today with complaint of DFM. She states she is feeling movement however less than usual.  Fetal kick counts 2 per hour prior to eating and 2 per hour post eating. So decided to present for further evaluation. Denis VB, LOF.    Receives care at [facility]. Prenatal records reviewed.  Pertinent items noted in HPI and remainder of comprehensive ROS otherwise negative.   O BP 117/61   Pulse 90   Temp 98.4 F (36.9 C) (Oral)   Resp 18   Ht 5\' 9"  (1.753 m)   Wt 76.3 kg   LMP 08/07/2022   SpO2 100%   BMI 24.84 kg/m  Physical Exam Vitals and nursing note reviewed.  Constitutional:      General: She is not in acute distress.    Appearance: Normal appearance. She is not ill-appearing.  HENT:     Head: Normocephalic and atraumatic.     Right Ear: External ear normal.     Left Ear: External ear normal.     Nose: Nose normal.     Mouth/Throat:     Mouth: Mucous membranes are moist.     Pharynx: Oropharynx is clear.  Eyes:     Extraocular Movements: Extraocular movements intact.     Conjunctiva/sclera: Conjunctivae normal.  Cardiovascular:     Rate and Rhythm: Tachycardia present.  Pulmonary:     Effort: Pulmonary effort is normal. No respiratory distress.  Abdominal:     Comments: gravid  Musculoskeletal:        General: Normal range of motion.     Cervical back: Normal range of motion.  Skin:    General: Skin is warm and dry.  Neurological:     Mental Status: She is alert and oriented to person, place, and time. Mental status is at baseline.  Psychiatric:        Mood and Affect: Mood normal.        Behavior: Behavior normal.      MDM: MAU Course:  NST: 150bpm, mild variability, 10x10accels, no decels, no ctx  BPP: 8/8  Wet prep negative GC collected  UA noninfectious   NST: 145bpm, moderate variability, 15x15accels, no decels, no ctx  Spoke with Dr. Connye Burkitt who is reassured  by BPP and NST after BPP.  Stable for d/c. Pt comfortable with plan of d/c with strict / usual return precautions   A&P: #[redacted] weeks gestation #False Labor   Discharge from MAU in stable condition with strict/usual precautions Follow up at [facility] as scheduled for ongoing prenatal care  Allergies as of 04/26/2023       Reactions   Tape Itching, Other (See Comments)        Medication List     TAKE these medications    acetaminophen 500 MG tablet Commonly known as: TYLENOL Take 2 tablets (1,000 mg total) by mouth every 8 (eight) hours as needed.   Flovent HFA 220 MCG/ACT inhaler Generic drug: fluticasone Inhale 2 puffs into the lungs 2 (two) times daily. Needs a refill on this   fluticasone 50 MCG/ACT nasal spray Commonly known as: FLONASE Place 1 spray into both nostrils daily.   Prenatal 28-0.8 MG Tabs Take 1 tablet by mouth daily.        Hessie Dibble, MD 04/26/2023 3:08 PM

## 2023-04-28 LAB — GC/CHLAMYDIA PROBE AMP (~~LOC~~) NOT AT ARMC
Chlamydia: NEGATIVE
Comment: NEGATIVE
Comment: NORMAL
Neisseria Gonorrhea: NEGATIVE

## 2023-05-02 DIAGNOSIS — F4323 Adjustment disorder with mixed anxiety and depressed mood: Secondary | ICD-10-CM | POA: Diagnosis not present

## 2023-05-04 NOTE — L&D Delivery Note (Signed)
Delivery Note At 4:58 AM a viable female was delivered via VBAC, Spontaneous (Presentation: Left Occiput Anterior).  APGAR: 8, 9; weight  placenta.   Placenta status: Spontaneous, Intact.  Cord: 3 vessels with the following complications:  None.  Cord pH: n/a  Anesthesia: Epidural Episiotomy: None Lacerations: 2nd degree Suture Repair: 2.0 vicryl Est. Blood Loss (mL):  321  Mom to postpartum.  Baby to Couplet care / Skin to Skin.  Purcell Nails 05/31/2023, 5:44 AM

## 2023-05-10 ENCOUNTER — Other Ambulatory Visit: Payer: Self-pay

## 2023-05-10 LAB — OB RESULTS CONSOLE GBS: GBS: POSITIVE

## 2023-05-10 LAB — OB RESULTS CONSOLE GC/CHLAMYDIA
Chlamydia: NEGATIVE
Neisseria Gonorrhea: NEGATIVE

## 2023-05-10 MED ORDER — RSV PRE-FUSION F A&B VAC RCMB 120 MCG/0.5ML IM SOLR
0.5000 mL | Freq: Once | INTRAMUSCULAR | 0 refills | Status: AC
  Filled 2023-05-10: qty 0.5, 1d supply, fill #0

## 2023-05-11 DIAGNOSIS — F4323 Adjustment disorder with mixed anxiety and depressed mood: Secondary | ICD-10-CM | POA: Diagnosis not present

## 2023-05-31 ENCOUNTER — Inpatient Hospital Stay (HOSPITAL_COMMUNITY): Payer: 59 | Admitting: Anesthesiology

## 2023-05-31 ENCOUNTER — Encounter (HOSPITAL_COMMUNITY): Payer: Self-pay | Admitting: Obstetrics and Gynecology

## 2023-05-31 ENCOUNTER — Other Ambulatory Visit: Payer: Self-pay

## 2023-05-31 ENCOUNTER — Inpatient Hospital Stay (HOSPITAL_COMMUNITY)
Admission: AD | Admit: 2023-05-31 | Discharge: 2023-06-01 | DRG: 806 | Disposition: A | Discharge status: Home / Self Care | Payer: 59 | Attending: Obstetrics and Gynecology | Admitting: Obstetrics and Gynecology

## 2023-05-31 DIAGNOSIS — O26893 Other specified pregnancy related conditions, third trimester: Secondary | ICD-10-CM | POA: Diagnosis present

## 2023-05-31 DIAGNOSIS — Z3A39 39 weeks gestation of pregnancy: Secondary | ICD-10-CM | POA: Diagnosis not present

## 2023-05-31 DIAGNOSIS — O99824 Streptococcus B carrier state complicating childbirth: Secondary | ICD-10-CM | POA: Diagnosis present

## 2023-05-31 DIAGNOSIS — D62 Acute posthemorrhagic anemia: Secondary | ICD-10-CM | POA: Diagnosis present

## 2023-05-31 DIAGNOSIS — O34219 Maternal care for unspecified type scar from previous cesarean delivery: Secondary | ICD-10-CM | POA: Diagnosis present

## 2023-05-31 DIAGNOSIS — Z98891 History of uterine scar from previous surgery: Secondary | ICD-10-CM

## 2023-05-31 DIAGNOSIS — O4690 Antepartum hemorrhage, unspecified, unspecified trimester: Secondary | ICD-10-CM

## 2023-05-31 DIAGNOSIS — O9902 Anemia complicating childbirth: Secondary | ICD-10-CM | POA: Diagnosis present

## 2023-05-31 DIAGNOSIS — O479 False labor, unspecified: Principal | ICD-10-CM

## 2023-05-31 LAB — HIV ANTIBODY (ROUTINE TESTING W REFLEX): HIV Screen 4th Generation wRfx: NONREACTIVE

## 2023-05-31 LAB — CBC
HCT: 35.6 % — ABNORMAL LOW (ref 36.0–46.0)
Hemoglobin: 11.7 g/dL — ABNORMAL LOW (ref 12.0–15.0)
MCH: 29.5 pg (ref 26.0–34.0)
MCHC: 32.9 g/dL (ref 30.0–36.0)
MCV: 89.7 fL (ref 80.0–100.0)
Platelets: 237 10*3/uL (ref 150–400)
RBC: 3.97 MIL/uL (ref 3.87–5.11)
RDW: 13.3 % (ref 11.5–15.5)
WBC: 8.6 10*3/uL (ref 4.0–10.5)
nRBC: 0 % (ref 0.0–0.2)

## 2023-05-31 LAB — TYPE AND SCREEN
ABO/RH(D): O POS
Antibody Screen: NEGATIVE

## 2023-05-31 LAB — RPR: RPR Ser Ql: NONREACTIVE

## 2023-05-31 MED ORDER — FENTANYL CITRATE (PF) 100 MCG/2ML IJ SOLN: 50.0000 ug | Freq: Once | INTRAMUSCULAR | Status: DC

## 2023-05-31 MED ORDER — PHENYLEPHRINE 80 MCG/ML (10ML) SYRINGE FOR IV PUSH (FOR BLOOD PRESSURE SUPPORT): 80.0000 ug | PREFILLED_SYRINGE | INTRAVENOUS | Status: DC | PRN

## 2023-05-31 MED ORDER — FLEET ENEMA RE ENEM: 1.0000 | ENEMA | RECTAL | Status: DC | PRN

## 2023-05-31 MED ORDER — ONDANSETRON HCL 4 MG/2ML IJ SOLN: 4.0000 mg | INTRAMUSCULAR | Status: DC | PRN

## 2023-05-31 MED ORDER — DIBUCAINE (PERIANAL) 1 % EX OINT: 1.0000 | TOPICAL_OINTMENT | CUTANEOUS | Status: DC | PRN

## 2023-05-31 MED ORDER — EPHEDRINE 5 MG/ML INJ
10.0000 mg | INTRAVENOUS | Status: DC | PRN
Start: 2023-05-31 — End: 2023-05-31

## 2023-05-31 MED ORDER — LACTATED RINGERS IV SOLN: INTRAVENOUS | Status: DC

## 2023-05-31 MED ORDER — FENTANYL-BUPIVACAINE-NACL 0.5-0.125-0.9 MG/250ML-% EP SOLN
12.0000 mL/h | EPIDURAL | Status: DC | PRN
  Administered 2023-05-31: 12 mL/h via EPIDURAL
  Filled 2023-05-31: qty 250

## 2023-05-31 MED ORDER — PRENATAL MULTIVITAMIN CH
1.0000 | ORAL_TABLET | Freq: Every day | ORAL | Status: DC
  Administered 2023-05-31 – 2023-06-01 (×2): 1 via ORAL
  Filled 2023-05-31 (×2): qty 1

## 2023-05-31 MED ORDER — LACTATED RINGERS IV SOLN
500.0000 mL | Freq: Once | INTRAVENOUS | Status: AC
  Administered 2023-05-31: 500 mL via INTRAVENOUS

## 2023-05-31 MED ORDER — SOD CITRATE-CITRIC ACID 500-334 MG/5ML PO SOLN: 30.0000 mL | ORAL | Status: DC | PRN

## 2023-05-31 MED ORDER — LACTATED RINGERS IV SOLN: 500.0000 mL | INTRAVENOUS | Status: DC | PRN

## 2023-05-31 MED ORDER — PHENYLEPHRINE 80 MCG/ML (10ML) SYRINGE FOR IV PUSH (FOR BLOOD PRESSURE SUPPORT)
80.0000 ug | PREFILLED_SYRINGE | INTRAVENOUS | Status: DC | PRN
  Filled 2023-05-31: qty 10

## 2023-05-31 MED ORDER — WITCH HAZEL-GLYCERIN EX PADS: 1.0000 | MEDICATED_PAD | CUTANEOUS | Status: DC | PRN

## 2023-05-31 MED ORDER — SENNOSIDES-DOCUSATE SODIUM 8.6-50 MG PO TABS
2.0000 | ORAL_TABLET | Freq: Every day | ORAL | Status: DC
  Administered 2023-05-31: 2 via ORAL
  Filled 2023-05-31: qty 2

## 2023-05-31 MED ORDER — ONDANSETRON HCL 4 MG PO TABS: 4.0000 mg | ORAL_TABLET | ORAL | Status: DC | PRN

## 2023-05-31 MED ORDER — OXYCODONE HCL 5 MG PO TABS
10.0000 mg | ORAL_TABLET | ORAL | Status: DC | PRN
  Administered 2023-06-01: 10 mg via ORAL
  Filled 2023-05-31: qty 2

## 2023-05-31 MED ORDER — BENZOCAINE-MENTHOL 20-0.5 % EX AERO: 1.0000 | INHALATION_SPRAY | CUTANEOUS | Status: DC | PRN

## 2023-05-31 MED ORDER — EPHEDRINE 5 MG/ML INJ: 10.0000 mg | INTRAVENOUS | Status: DC | PRN

## 2023-05-31 MED ORDER — PENICILLIN G POT IN DEXTROSE 60000 UNIT/ML IV SOLN
3.0000 10*6.[IU] | INTRAVENOUS | Status: DC
  Filled 2023-05-31 (×2): qty 50

## 2023-05-31 MED ORDER — ACETAMINOPHEN 325 MG PO TABS
650.0000 mg | ORAL_TABLET | ORAL | Status: DC | PRN
  Administered 2023-05-31 – 2023-06-01 (×4): 650 mg via ORAL
  Filled 2023-05-31 (×4): qty 2

## 2023-05-31 MED ORDER — TRANEXAMIC ACID-NACL 1000-0.7 MG/100ML-% IV SOLN
1000.0000 mg | INTRAVENOUS | Status: AC
  Administered 2023-05-31: 1000 mg via INTRAVENOUS

## 2023-05-31 MED ORDER — TETANUS-DIPHTH-ACELL PERTUSSIS 5-2.5-18.5 LF-MCG/0.5 IM SUSY
0.5000 mL | PREFILLED_SYRINGE | Freq: Once | INTRAMUSCULAR | Status: DC
Start: 2023-06-01 — End: 2023-06-01

## 2023-05-31 MED ORDER — SENNOSIDES-DOCUSATE SODIUM 8.6-50 MG PO TABS: 2.0000 | ORAL_TABLET | Freq: Every day | ORAL | Status: DC

## 2023-05-31 MED ORDER — LACTATED RINGERS IV SOLN: 500.0000 mL | Freq: Once | INTRAVENOUS | Status: DC

## 2023-05-31 MED ORDER — OXYTOCIN BOLUS FROM INFUSION
333.0000 mL | Freq: Once | INTRAVENOUS | Status: AC
  Administered 2023-05-31: 333 mL via INTRAVENOUS

## 2023-05-31 MED ORDER — LIDOCAINE HCL (PF) 1 % IJ SOLN
30.0000 mL | INTRAMUSCULAR | Status: AC | PRN
  Administered 2023-05-31: 30 mL via SUBCUTANEOUS
  Filled 2023-05-31: qty 30

## 2023-05-31 MED ORDER — FENTANYL CITRATE (PF) 100 MCG/2ML IJ SOLN
100.0000 ug | INTRAMUSCULAR | Status: DC | PRN
  Administered 2023-05-31: 100 ug via INTRAVENOUS
  Filled 2023-05-31: qty 2

## 2023-05-31 MED ORDER — TRANEXAMIC ACID-NACL 1000-0.7 MG/100ML-% IV SOLN
INTRAVENOUS | Status: AC
  Administered 2023-05-31: 1000 mg
  Filled 2023-05-31: qty 100

## 2023-05-31 MED ORDER — DIPHENHYDRAMINE HCL 50 MG/ML IJ SOLN: 12.5000 mg | INTRAMUSCULAR | Status: DC | PRN

## 2023-05-31 MED ORDER — ONDANSETRON HCL 4 MG/2ML IJ SOLN: 4.0000 mg | Freq: Four times a day (QID) | INTRAMUSCULAR | Status: DC | PRN

## 2023-05-31 MED ORDER — TRANEXAMIC ACID-NACL 1000-0.7 MG/100ML-% IV SOLN
INTRAVENOUS | Status: AC
  Filled 2023-05-31: qty 100

## 2023-05-31 MED ORDER — COCONUT OIL OIL: 1.0000 | TOPICAL_OIL | Status: DC | PRN

## 2023-05-31 MED ORDER — IBUPROFEN 600 MG PO TABS
600.0000 mg | ORAL_TABLET | Freq: Four times a day (QID) | ORAL | Status: DC
  Administered 2023-05-31 – 2023-06-01 (×5): 600 mg via ORAL
  Filled 2023-05-31 (×5): qty 1

## 2023-05-31 MED ORDER — OXYCODONE-ACETAMINOPHEN 5-325 MG PO TABS: 1.0000 | ORAL_TABLET | ORAL | Status: DC | PRN

## 2023-05-31 MED ORDER — OXYCODONE HCL 5 MG PO TABS
5.0000 mg | ORAL_TABLET | ORAL | Status: DC | PRN
  Administered 2023-06-01: 5 mg via ORAL
  Filled 2023-05-31: qty 1

## 2023-05-31 MED ORDER — ZOLPIDEM TARTRATE 5 MG PO TABS: 5.0000 mg | ORAL_TABLET | Freq: Every evening | ORAL | Status: DC | PRN

## 2023-05-31 MED ORDER — SIMETHICONE 80 MG PO CHEW: 80.0000 mg | CHEWABLE_TABLET | ORAL | Status: DC | PRN

## 2023-05-31 MED ORDER — SODIUM CHLORIDE 0.9 % IV SOLN
5.0000 10*6.[IU] | Freq: Once | INTRAVENOUS | Status: DC
  Filled 2023-05-31: qty 5

## 2023-05-31 MED ORDER — OXYCODONE-ACETAMINOPHEN 5-325 MG PO TABS: 2.0000 | ORAL_TABLET | ORAL | Status: DC | PRN

## 2023-05-31 MED ORDER — OXYTOCIN-SODIUM CHLORIDE 30-0.9 UT/500ML-% IV SOLN
2.5000 [IU]/h | INTRAVENOUS | Status: DC
  Filled 2023-05-31: qty 500

## 2023-05-31 MED ORDER — ACETAMINOPHEN 325 MG PO TABS: 650.0000 mg | ORAL_TABLET | ORAL | Status: DC | PRN

## 2023-05-31 MED ORDER — DIPHENHYDRAMINE HCL 25 MG PO CAPS: 25.0000 mg | ORAL_CAPSULE | Freq: Four times a day (QID) | ORAL | Status: DC | PRN

## 2023-05-31 MED ORDER — LIDOCAINE HCL (PF) 1 % IJ SOLN
INTRAMUSCULAR | Status: DC | PRN
  Administered 2023-05-31: 4 mL via EPIDURAL
  Administered 2023-05-31: 5 mL via EPIDURAL

## 2023-05-31 NOTE — Anesthesia Preprocedure Evaluation (Signed)
Anesthesia Evaluation  Patient identified by MRN, date of birth, ID band Patient awake    Reviewed: Allergy & Precautions, NPO status , Patient's Chart, lab work & pertinent test results  History of Anesthesia Complications Negative for: history of anesthetic complications  Airway Mallampati: II   Neck ROM: Full    Dental   Pulmonary neg pulmonary ROS   Pulmonary exam normal        Cardiovascular negative cardio ROS Normal cardiovascular exam     Neuro/Psych negative neurological ROS  negative psych ROS   GI/Hepatic negative GI ROS, Neg liver ROS,,,  Endo/Other  negative endocrine ROS    Renal/GU negative Renal ROS     Musculoskeletal negative musculoskeletal ROS (+)    Abdominal   Peds  Hematology  (+) Blood dyscrasia, anemia  Plt 237k    Anesthesia Other Findings   Reproductive/Obstetrics (+) Pregnancy  TOLAC                              Anesthesia Physical Anesthesia Plan  ASA: 2  Anesthesia Plan: Epidural   Post-op Pain Management:    Induction:   PONV Risk Score and Plan: 2 and Treatment may vary due to age or medical condition  Airway Management Planned: Natural Airway  Additional Equipment: None  Intra-op Plan:   Post-operative Plan:   Informed Consent: I have reviewed the patients History and Physical, chart, labs and discussed the procedure including the risks, benefits and alternatives for the proposed anesthesia with the patient or authorized representative who has indicated his/her understanding and acceptance.       Plan Discussed with: Anesthesiologist  Anesthesia Plan Comments: (Labs reviewed. Platelets acceptable, patient not taking any blood thinning medications. Per RN, FHR tracing reported to be stable enough for sitting procedure. Risks and benefits discussed with patient, including PDPH, backache, epidural hematoma, failed epidural, blood  pressure changes, allergic reaction, and nerve injury. Patient expressed understanding and wished to proceed.)       Anesthesia Quick Evaluation

## 2023-05-31 NOTE — H&P (Addendum)
Gloria Estes is a 28 y.o. female presenting for bleeding and contractions.  I was called by Wynelle Bourgeois, CNM in MAU and she reported heavier bleeding than expected with large blood clot and h/o c-section.  The pt was stable at that point though and bleeding was minimal.  Upon my arrival to L&D pt was just s/p an epidural and 8cm/100/0.  OB History     Gravida  5   Para  2   Term  1   Preterm  1   AB  2   Living  2      SAB      IAB  2   Ectopic      Multiple  0   Live Births  2          History reviewed. No pertinent past medical history. Past Surgical History:  Procedure Laterality Date   CESAREAN SECTION N/A 05/03/2021   Procedure: CESAREAN SECTION;  Surgeon: Gerald Leitz, MD;  Location: [facility] LD ORS;  Service: Obstetrics;  Laterality: N/A;  Primary C/S PPROM Breech   DILATION AND CURETTAGE OF UTERUS     Family History: family history includes Healthy in her father and mother. Social History:  reports that she has never smoked. She has never used smokeless tobacco. She reports that she does not currently use alcohol. She reports that she does not use drugs.     Maternal Diabetes: No Genetic Screening: Declined Maternal Ultrasounds/Referrals: Normal Fetal Ultrasounds or other Referrals:  None Maternal Substance Abuse:  No Significant Maternal Medications:  Meds include: Other: vaginal progesterone Significant Maternal Lab Results:  Group B Strep positive Number of Prenatal Visits:greater than 3 verified prenatal visits Maternal Vaccinations:RSV: Given during pregnancy >/=14 days ago.  Tdap 03/29/23 and Flu vaccine 03/14/23. Other Comments:   low lying placenta earlier in the pregnancy that resolved  Review of Systems No F/C/N/V/D History Dilation: 10 Effacement (%): 100 Station: Crowning Exam by:: Su Hilt MD Blood pressure (!) 141/120, pulse (!) 110, temperature 98.3 F (36.8 C), resp. rate 16, last menstrual period 08/07/2022, SpO2 98%. Exam Physical  Exam  Lungs unlabored breathing CV RRR Abdomen gravid, NT Extremities no calf tenderness  FHT cat 1 Toco regular ctxs AROM clear fluid   Prenatal labs: ABO, Rh: --/--/O POS (01/28 1610) Antibody: NEG (01/28 0357) Rubella:  Immune RPR:   NR 2022, Can't find it in prenatal records for this pregnancy and hospital RPR is pending HBsAg:   Negative HIV:   NR GBS:   Positive  Assessment/Plan: 96EA [id number] at 70 1/7wks presenting in labor with heavy bloody show and progressed quickly delivery a viable female infant. Pt did not receive antibiotics d/t precipitous delivery.  Purcell Nails 05/31/2023, 5:32 AM

## 2023-05-31 NOTE — Lactation Note (Signed)
This note was copied from a baby's chart. Lactation Consultation Note  Patient Name: Gloria Estes [id number] Date: 05/31/2023 Age:26 hours Reason for consult: Initial assessment;Term   P3 mom of 5 hour old infant consulted for initial assessment. Mom endorses that baby is feeding well at the breast; denies concerns. Mom is experienced with breastfeeding and understands infant feeding frequency. LC reviewed cluster feeding and transitional milk occurring faster this time as it does with each pregnancy. Encouraged hand expression and discussed options to spoon feed if needed at any time. Mom asked when pumping could occur; discussed if breastfeeding is going well and there's no medical concern or infant weight loss, pumping can initiate after 4 weeks PP. Mom does have a Spectra pump for home. Infant is not named at this time; parents are deciding on a name.  Discussed LC Services brochure and reaching lactation once home via the help line.   Maternal Data Does the patient have breastfeeding experience prior to this delivery?: Yes How long did the patient breastfeed?: 1 year with now 26 year old; 10 months with now 26 yo  Feeding Mother's Current Feeding Choice: Breast Milk  LATCH Score  Infant asleep at time of consult  Lactation Tools Discussed/Used    Interventions Interventions: Breast feeding basics reviewed;Education;LC Services brochure  Discharge Pump: Personal (Spectra)  Consult Status Consult Status: Follow-up Date: 06/01/23 Follow-up type: In-patient    Su Grand 05/31/2023, 10:28 AM

## 2023-05-31 NOTE — MAU Note (Signed)
..  Gloria Estes is a 28 y.o. at [redacted]w[redacted]d here in MAU reporting: brought directly into room d/t vaginal bleeding. Artelia Laroche called to bedside.  Patient reports bleeding started around 0130.  Contractions 6 hours ago that started getting more consistent around midnight, reports that she has not been able to count the frequency. Has not been able to feel baby move since contractions got more intense.   Pain score: 5/10 Vitals:   05/31/23 0205 05/31/23 0209  BP: 133/81   Pulse: 68   Resp: 16   SpO2:  100%     FHT:135 Lab orders placed from triage:  none

## 2023-05-31 NOTE — Anesthesia Procedure Notes (Signed)
Epidural Patient location during procedure: OB Start time: 05/31/2023 4:41 AM End time: 05/31/2023 4:44 AM  Staffing Anesthesiologist: Beryle Lathe, MD Performed: anesthesiologist   Preanesthetic Checklist Completed: patient identified, IV checked, risks and benefits discussed, monitors and equipment checked, pre-op evaluation and timeout performed  Epidural Patient position: sitting Prep: DuraPrep Patient monitoring: continuous pulse ox and blood pressure Approach: midline Location: L2-L3 Injection technique: LOR saline  Needle:  Needle type: Tuohy  Needle gauge: 17 G Needle length: 9 cm Needle insertion depth: 5 cm Catheter size: 19 Gauge Catheter at skin depth: 10 cm Test dose: negative and Other (1% lidocaine)  Assessment Events: blood not aspirated and no cerebrospinal fluid  Additional Notes Patient identified. Risks including, but not limited to, bleeding, infection, nerve damage, paralysis, inadequate analgesia, blood pressure changes, nausea, vomiting, allergic reaction, postpartum back pain, itching, and headache were discussed. Patient expressed understanding and wished to proceed. Sterile prep and drape, including hand hygiene, mask, and sterile gloves were used. The patient was positioned and the spine was prepped. The skin was anesthetized with lidocaine. No paraesthesia or other complication noted. The patient did not experience any signs of intravascular injection such as tinnitus or metallic taste in mouth, nor signs of intrathecal spread such as rapid motor block. Please see nursing notes for vital signs. The patient tolerated the procedure well.   Leslye Peer, MDReason for block:procedure for pain

## 2023-05-31 NOTE — MAU Provider Note (Signed)
Chief Complaint:  No chief complaint on file.   Event Date/Time   First Provider Initiated Contact with Patient 05/31/23 0205     HPI: Gloria Estes is a 28 y.o. [id number] at 34w1dwho presents to maternity admissions reporting uterine contractions and heavy bleeding Reports a history of prior cesarean delivery. . She reports good fetal movement, denies LOF, urinary symptoms,  dizziness, n/v, diarrhea, constipation or fever/chills.    Vaginal Bleeding The patient's primary symptoms include pelvic pain and vaginal bleeding. This is a new problem. The current episode started today. She is pregnant. Associated symptoms include abdominal pain. Pertinent negatives include no fever, headaches or nausea. The vaginal discharge was watery and bloody. The vaginal bleeding is heavier than menses. She has not been passing clots. She has not been passing tissue. Nothing aggravates the symptoms. She has tried nothing for the symptoms.    Past Medical History: No past medical history on file.  Past obstetric history: OB History  Gravida Para Term Preterm AB Living  5 2 1 1 2 2   SAB IAB Ectopic Multiple Live Births   2  0 2    # Outcome Date GA Lbr Len/2nd Weight Sex Type Anes PTL Lv  5 Current           4 Preterm 05/03/21 [redacted]w[redacted]d  1980 g F CS-LTranv Spinal  LIV  3 IAB 2022     TAB     2 IAB 2021          1 Term 2019    M Vag-Spont   LIV    Past Surgical History: Past Surgical History:  Procedure Laterality Date   CESAREAN SECTION N/A 05/03/2021   Procedure: CESAREAN SECTION;  Surgeon: Gerald Leitz, MD;  Location: [facility] LD ORS;  Service: Obstetrics;  Laterality: N/A;  Primary C/S PPROM Breech   DILATION AND CURETTAGE OF UTERUS      Family History: Family History  Problem Relation Age of Onset   Healthy Mother    Healthy Father     Social History: Social History   Tobacco Use   Smoking status: Never   Smokeless tobacco: Never  Vaping Use   Vaping status: Never Used  Substance Use Topics    Alcohol use: Not Currently    Comment: social   Drug use: Never    Allergies:  Allergies  Allergen Reactions   Tape Itching and Other (See Comments)    Meds:  Medications Prior to Admission  Medication Sig Dispense Refill Last Dose/Taking   acetaminophen (TYLENOL) 500 MG tablet Take 2 tablets (1,000 mg total) by mouth every 8 (eight) hours as needed. (Patient not taking: Reported on 02/10/2023) 30 tablet 0    FLOVENT HFA 220 MCG/ACT inhaler Inhale 2 puffs into the lungs 2 (two) times daily. Needs a refill on this (Patient not taking: Reported on 02/10/2023)      fluticasone (FLONASE) 50 MCG/ACT nasal spray Place 1 spray into both nostrils daily. (Patient not taking: Reported on 02/10/2023) 11.1 mL 2    Prenatal 28-0.8 MG TABS Take 1 tablet by mouth daily.       I have reviewed patient's Past Medical Hx, Surgical Hx, Family Hx, Social Hx, medications and allergies.   ROS:  Review of Systems  Constitutional:  Negative for fever.  Gastrointestinal:  Positive for abdominal pain. Negative for nausea.  Genitourinary:  Positive for pelvic pain and vaginal bleeding.  Neurological:  Negative for headaches.   Other systems negative  Physical Exam  No data found. Constitutional: Well-developed, well-nourished female in no acute distress.  Cardiovascular: normal rate and rhythm Respiratory: normal effort, clear to auscultation bilaterally GI: Abd soft, non-tender, gravid appropriate for gestational age.   No rebound or guarding. MS: Extremities nontender, no edema, normal ROM Neurologic: Alert and oriented x 4.  GU: Neg CVAT.  PELVIC EXAM:  Dilation: 2.5 Effacement (%): 80 Station: -2 Exam by:: Artelia Laroche, CNM Moderate watery burgundy blood on exam  FHT:  Baseline 135 , moderate variability, accelerations present, no decelerations Contractions: q 2 mins Irregular     Labs: No results found for this or any previous visit (from the past 24 hours).    Imaging:  No results  found.  MAU Course/MDM: I have reviewed the triage vital signs and the nursing notes.   Pertinent labs & imaging results that were available during my care of the patient were reviewed by me and considered in my medical decision making (see chart for details).      I have reviewed her medical records including past results, notes and treatments.   I have ordered labs and admission orders NST reviewed Consult Dr Debroah Loop and Dr Su Hilt with presentation, exam findings and test results.  Treatments in MAU included EFM, cervical exam Bleeding after admission was small but she did pass a 4cm clot.  .    Assessment: Single IUP at [redacted]w[redacted]d Active Labor Prior Cesarean Delivery Vaginal bleeding    Plan: Admit to Labor and Delivery Routine orders MD to follow.  Wynelle Bourgeois CNM, MSN Certified Nurse-Midwife 05/31/2023 2:05 AM

## 2023-05-31 NOTE — Lactation Note (Signed)
This note was copied from a baby's chart. Lactation Consultation Note  Patient Name: Girl Oria Klimas [id number] Date: 05/31/2023 Age:26 hours Reason for consult: L&D Initial assessment;Term Mom holding baby STS. Assisted to latch. Baby latched w/ease. Mom denies painful latch. Mom stated she's going to be a good BF. Experienced BF mom happy about how the baby is BF. Praised mom. Left for bonding time. Maternal Data Has patient been taught Hand Expression?: Yes Does the patient have breastfeeding experience prior to this delivery?: Yes How long did the patient breastfeed?: 1 yr to her now 26 yr old and 10 months to her now 26 yr old  Feeding    LATCH Score Latch: Grasps breast easily, tongue down, lips flanged, rhythmical sucking.  Audible Swallowing: A few with stimulation  Type of Nipple: Everted at rest and after stimulation  Comfort (Breast/Nipple): Soft / non-tender  Hold (Positioning): Assistance needed to correctly position infant at breast and maintain latch.  LATCH Score: 8   Lactation Tools Discussed/Used    Interventions Interventions: Assisted with latch;Skin to skin;Hand express  Discharge    Consult Status Consult Status: Follow-up from L&D Date: 05/31/23 Follow-up type: In-patient    Charyl Dancer 05/31/2023, 6:05 AM

## 2023-05-31 NOTE — Anesthesia Postprocedure Evaluation (Signed)
Anesthesia Post Note  Patient: Gloria Estes  Procedure(s) Performed: AN AD HOC LABOR EPIDURAL     Patient location during evaluation: Mother Baby Anesthesia Type: Epidural Level of consciousness: awake, awake and alert and oriented Pain management: satisfactory to patient Vital Signs Assessment: post-procedure vital signs reviewed and stable Respiratory status: spontaneous breathing, nonlabored ventilation and respiratory function stable Cardiovascular status: blood pressure returned to baseline and stable Postop Assessment: no headache, no backache, no apparent nausea or vomiting, able to ambulate, adequate PO intake and patient able to bend at knees Anesthetic complications: no   No notable events documented.  Last Vitals:  Vitals:   05/31/23 1305 05/31/23 1732  BP: 115/70 114/70  Pulse: (!) 56 60  Resp: 16 16  Temp: 36.7 C 36.9 C  SpO2: 100% 100%    Last Pain:  Vitals:   05/31/23 1732  TempSrc: Oral  PainSc: 6    Pain Goal:                   Anani Gu

## 2023-06-01 LAB — CBC
HCT: 29.2 % — ABNORMAL LOW (ref 36.0–46.0)
Hemoglobin: 9.7 g/dL — ABNORMAL LOW (ref 12.0–15.0)
MCH: 29.7 pg (ref 26.0–34.0)
MCHC: 33.2 g/dL (ref 30.0–36.0)
MCV: 89.3 fL (ref 80.0–100.0)
Platelets: 188 10*3/uL (ref 150–400)
RBC: 3.27 MIL/uL — ABNORMAL LOW (ref 3.87–5.11)
RDW: 13.4 % (ref 11.5–15.5)
WBC: 10.2 10*3/uL (ref 4.0–10.5)
nRBC: 0 % (ref 0.0–0.2)

## 2023-06-01 LAB — SURGICAL PATHOLOGY

## 2023-06-01 MED ORDER — FERROUS GLUCONATE 324 (38 FE) MG PO TABS: 324.0000 mg | ORAL_TABLET | Freq: Every day | ORAL | 0 refills | Status: AC

## 2023-06-01 MED ORDER — IBUPROFEN 600 MG PO TABS: 600.0000 mg | ORAL_TABLET | Freq: Four times a day (QID) | ORAL | 0 refills | Status: AC | PRN

## 2023-06-01 NOTE — Progress Notes (Signed)
Post Partum Day 1 Subjective: no complaints, up ad lib, voiding, tolerating PO, and + flatus  Objective: Blood pressure 124/75, pulse (!) 53, temperature 97.8 F (36.6 C), temperature source Oral, resp. rate 17, height 5\' 9"  (1.753 m), weight 78.9 kg, last menstrual period 08/07/2022, SpO2 100%, unknown if currently breastfeeding.  Physical Exam:  General: alert, cooperative, and no distress Lochia: appropriate Uterine Fundus: firm Incision: NA DVT Evaluation: No evidence of DVT seen on physical exam.  Recent Labs    05/31/23 0357 06/01/23 0440  HGB 11.7* 9.7*  HCT 35.6* 29.2*    Assessment/Plan: Plan for discharge tomorrow, Breastfeeding, and Lactation consult Routine postpartum care   LOS: 1 day   Gerald Leitz, MD 06/01/2023, 11:24 AM

## 2023-06-01 NOTE — Lactation Note (Addendum)
This note was copied from a baby's chart. Lactation Consultation Note  Patient Name: Gloria Estes [id number] Date: 06/01/2023 Age:26 hours  Reason for consult: Follow-up assessment;Term;Breastfeeding assistance  Follow up LC visit. Mother has history of positive breastfeeding experience. She can easily express colostrum prior to latch. Colostrum appears to be transitioning from colostrum. [name] "Erin" latched well and feeding with swallows.  Lab drew bilirubin prior to feeding. Mother hopeful for discharge today, pending bili results. Mother reports she has approx. 20 ml of harvested colostrum at home and she will to baby as needed. Denies any other concerns or questions.  Mom made aware of O/P services, breastfeeding support groups, and our phone # for post-discharge questions.     Maternal Data    Feeding Mother's Current Feeding Choice: Breast Milk  LATCH Score Latch: Grasps breast easily, tongue down, lips flanged, rhythmical sucking.  Audible Swallowing: Spontaneous and intermittent  Type of Nipple: Everted at rest and after stimulation  Comfort (Breast/Nipple): Soft / non-tender  Hold (Positioning): No assistance needed to correctly position infant at breast.  LATCH Score: 10    Interventions Interventions: Support pillows;Education  Discharge Discharge Education: Warning signs for feeding baby Pump: Personal  Consult Status Consult Status: Complete Date: 06/01/23    Omar Person 06/01/2023, 4:00 PM

## 2023-06-01 NOTE — Discharge Summary (Signed)
Postpartum Discharge Summary  Date of Service updated 06/01/2023     Patient Name: Gloria Estes DOB: [birth year] MRN: [id number]  Date of admission: 05/31/2023 Delivery date:05/31/2023 Delivering provider: Osborn Coho Date of discharge: 06/01/2023  Admitting diagnosis: Uterine contractions during pregnancy [O47.9] Intrauterine pregnancy: [redacted]w[redacted]d     Secondary diagnosis:  Principal Problem:   Uterine contractions during pregnancy  Additional problems: None    Discharge diagnosis: VBAC                                              Post partum procedures: NA Augmentation: N/A Complications: None  Hospital course: Onset of Labor With Vaginal Delivery      28 y.o. yo [id number] at [redacted]w[redacted]d was admitted in Active Labor on 05/31/2023. Labor course was complicated by precipitous delivery   Membrane Rupture Time/Date: 4:52 AM,05/31/2023  Delivery Method:VBAC, Spontaneous Operative Delivery:N/A Episiotomy: None Lacerations:  2nd degree Patient had a postpartum course was  uncomplicated.  She is ambulating, tolerating a regular diet, passing flatus, and urinating well. Patient is discharged home in stable condition on 06/01/23.  Newborn Data: Birth date:05/31/2023 Birth time:4:58 AM Gender:Female Living status:Living Apgars:8 ,9  Weight:3180 g  Magnesium Sulfate received: No BMZ received: No Rhophylac:N/A MMR:N/A T-DaP:Given prenatally Flu: N/A RSV Vaccine received: unknown Transfusion:No Immunizations administered: Immunization History  Administered Date(s) Administered   DTaP 09/26/1997, 12/09/1997, 01/29/1998, 02/04/1999, 07/31/2001   DTaP / HiB 09/26/1997, 12/09/1997, 01/29/1998, 02/04/1999   HPV Quadrivalent 10/09/2008, 05/26/2009, 10/07/2009   Hepatitis A, Ped/Adol-2 Dose 01/06/2012   Hepatitis B, PED/ADOLESCENT Jul 14, 1997, 08/27/1997, 01/29/1998   IPV 09/26/1997, 12/09/1997, 01/29/1998, 07/31/2001   Influenza,inj,quad, With Preservative 02/05/2010   MMR 07/28/1998,  07/31/2001   Meningococcal polysaccharide vaccine (MPSV4) 10/09/2008   Rsv, Bivalent, Protein Subunit Rsvpref,pf Verdis Frederickson) 05/10/2023   Tdap 10/09/2008, 02/28/2021   Varicella 07/28/1998, 09/01/2006    Physical exam  Vitals:   05/31/23 1732 05/31/23 2136 06/01/23 0531 06/01/23 1453  BP: 114/70 118/65 124/75 113/73  Pulse: 60 (!) 57 (!) 53 (!) 56  Resp: 16 17 17 16   Temp: 98.5 F (36.9 C) 98.7 F (37.1 C) 97.8 F (36.6 C) 98.2 F (36.8 C)  TempSrc: Oral Oral Oral Oral  SpO2: 100% 100%  100%  Weight:      Height:       General: alert, cooperative, and no distress Lochia: appropriate Uterine Fundus: firm Incision: N/A DVT Evaluation: No evidence of DVT seen on physical exam. Labs: Lab Results  Component Value Date   WBC 10.2 06/01/2023   HGB 9.7 (L) 06/01/2023   HCT 29.2 (L) 06/01/2023   MCV 89.3 06/01/2023   PLT 188 06/01/2023      Latest Ref Rng & Units 04/20/2023   10:33 AM  CMP  Glucose 70 - 99 mg/dL 85   BUN 6 - 20 mg/dL 10   Creatinine 3.08 - 1.00 mg/dL 6.57   Sodium 846 - 962 mmol/L 131   Potassium 3.5 - 5.1 mmol/L 3.9   Chloride 98 - 111 mmol/L 105   CO2 22 - 32 mmol/L 20   Calcium 8.9 - 10.3 mg/dL 8.4   Total Protein 6.5 - 8.1 g/dL 6.7   Total Bilirubin <9.5 mg/dL 0.3   Alkaline Phos 38 - 126 U/L 90   AST 15 - 41 U/L 17   ALT 0 - 44 U/L 11  Edinburgh Score:    05/31/2023    5:32 PM  Edinburgh Postnatal Depression Scale Screening Tool  I have been able to laugh and see the funny side of things. 0  I have looked forward with enjoyment to things. 0  I have blamed myself unnecessarily when things went wrong. 0  I have been anxious or worried for no good reason. 1  I have felt scared or panicky for no good reason. 0  Things have been getting on top of me. 0  I have been so unhappy that I have had difficulty sleeping. 0  I have felt sad or miserable. 0  I have been so unhappy that I have been crying. 0  The thought of harming myself has occurred  to me. 0  Edinburgh Postnatal Depression Scale Total 1      After visit meds:  Allergies as of 06/01/2023       Reactions   Tape Itching, Other (See Comments)        Medication List     TAKE these medications    acetaminophen 500 MG tablet Commonly known as: TYLENOL Take 2 tablets (1,000 mg total) by mouth every 8 (eight) hours as needed.   ferrous gluconate 324 MG tablet Commonly known as: FERGON Take 1 tablet (324 mg total) by mouth daily with breakfast.   Flovent HFA 220 MCG/ACT inhaler Generic drug: fluticasone Inhale 2 puffs into the lungs 2 (two) times daily. Needs a refill on this   fluticasone 50 MCG/ACT nasal spray Commonly known as: FLONASE Place 1 spray into both nostrils daily.   ibuprofen 600 MG tablet Commonly known as: ADVIL Take 1 tablet (600 mg total) by mouth every 6 (six) hours as needed.   Prenatal 28-0.8 MG Tabs Take 1 tablet by mouth daily.         Discharge home in stable condition Infant Feeding: Bottle and Breast Infant Disposition:home with mother Discharge instruction: per After Visit Summary and Postpartum booklet. Activity: Advance as tolerated. Pelvic rest for 6 weeks.  Diet: routine diet Anticipated Birth Control: Unsure Postpartum Appointment:2 weeks Additional Postpartum F/U: Postpartum Depression checkup in 2 weeks  Future Appointments:No future appointments. Follow up Visit:  Follow-up Information     Gerald Leitz, MD. Schedule an appointment as soon as possible for a visit in 2 week(s).   Specialty: Obstetrics and Gynecology Why: please schdule a postpartum visit in 2 weeks to evaluate for postpartum depression Contact information: [address] [place] [address] [phone]                     06/01/2023 Gerald Leitz, MD

## 2023-06-06 ENCOUNTER — Inpatient Hospital Stay (HOSPITAL_COMMUNITY): Admission: RE | Admit: 2023-06-06 | Payer: 59 | Source: Home / Self Care

## 2023-06-08 ENCOUNTER — Telehealth (HOSPITAL_COMMUNITY): Payer: Self-pay | Admitting: *Deleted

## 2023-06-08 NOTE — Telephone Encounter (Signed)
 06/08/2023  Name: Gloria Estes MRN: [id number] DOB: [birth year]  Reason for Call:  Transition of Care Hospital Discharge Call  Contact Status: Patient Contact Status: Complete  Language assistant needed: Interpreter Mode: Interpreter Not Needed        Follow-Up Questions: Do You Have Any Concerns About Your Health As You Heal From Delivery?: No Do You Have Any Concerns About Your Infants Health?: No  Edinburgh Postnatal Depression Scale:  In the Past 7 Days:    PHQ2-9 Depression Scale:     Discharge Follow-up: Edinburgh score requires follow up?:  (Declined screening today Has done with another clinician within the last week and she scored 1.  Endorses she is doing well emotionally.) Patient was advised of the following resources:: Support Group, Breastfeeding Support Group (declines postpartum group information via email)  Post-discharge interventions: Reviewed Newborn Safe Sleep Practices  Mliss Sieve, RN 06/08/2023 14:32

## 2023-09-13 ENCOUNTER — Telehealth: Admitting: Physician Assistant

## 2023-09-13 DIAGNOSIS — J028 Acute pharyngitis due to other specified organisms: Secondary | ICD-10-CM

## 2023-09-13 DIAGNOSIS — B9689 Other specified bacterial agents as the cause of diseases classified elsewhere: Secondary | ICD-10-CM | POA: Diagnosis not present

## 2023-09-13 MED ORDER — AMOXICILLIN 500 MG PO CAPS
500.0000 mg | ORAL_CAPSULE | Freq: Two times a day (BID) | ORAL | 0 refills | Status: AC
Start: 2023-09-13 — End: 2023-09-23

## 2023-09-13 NOTE — Patient Instructions (Signed)
 Gloria Estes, thank you for joining Angelia Kelp, PA-C for today's virtual visit.  While this provider is not your primary care provider (PCP), if your PCP is located in our provider database this encounter information will be shared with them immediately following your visit.   A [facility] MyChart account gives you access to today's visit and all your visits, tests, and labs performed at [facility] " click here if you don't have a [facility] MyChart account or go to [email or web address]  Consent: (Patient) Gloria Estes provided verbal consent for this virtual visit at the beginning of the encounter.  Current Medications:  Current Outpatient Medications:    amoxicillin  (AMOXIL ) 500 MG capsule, Take 1 capsule (500 mg total) by mouth 2 (two) times daily for 10 days., Disp: 20 capsule, Rfl: 0   acetaminophen  (TYLENOL ) 500 MG tablet, Take 2 tablets (1,000 mg total) by mouth every 8 (eight) hours as needed., Disp: 30 tablet, Rfl: 0   ferrous gluconate  (FERGON) 324 MG tablet, Take 1 tablet (324 mg total) by mouth daily with breakfast., Disp: 30 tablet, Rfl: 0   FLOVENT  HFA 220 MCG/ACT inhaler, Inhale 2 puffs into the lungs 2 (two) times daily. Needs a refill on this (Patient not taking: Reported on 02/10/2023), Disp: , Rfl:    fluticasone  (FLONASE ) 50 MCG/ACT nasal spray, Place 1 spray into both nostrils daily. (Patient not taking: Reported on 02/10/2023), Disp: 11.1 mL, Rfl: 2   ibuprofen  (ADVIL ) 600 MG tablet, Take 1 tablet (600 mg total) by mouth every 6 (six) hours as needed., Disp: 30 tablet, Rfl: 0   Prenatal 28-0.8 MG TABS, Take 1 tablet by mouth daily., Disp: , Rfl:    Medications ordered in this encounter:  Meds ordered this encounter  Medications   amoxicillin  (AMOXIL ) 500 MG capsule    Sig: Take 1 capsule (500 mg total) by mouth 2 (two) times daily for 10 days.    Dispense:  20 capsule    Refill:  0    Supervising Provider:   Corine Dice [[id number]]      *If you need refills on other medications prior to your next appointment, please contact your pharmacy*  Follow-Up: Call back or seek an in-person evaluation if the symptoms worsen or if the condition fails to improve as anticipated.   Virtual Care [phone]  Other Instructions  Pharyngitis  Pharyngitis is inflammation of the throat (pharynx). It is a very common cause of sore throat. Pharyngitis can be caused by a bacteria, but it is usually caused by a virus. Most cases of pharyngitis get better on their own without treatment. What are the causes? This condition may be caused by: Infection by viruses (viral). Viral pharyngitis spreads easily from person to person (is contagious) through coughing, sneezing, and sharing of personal items or utensils such as cups, forks, spoons, and toothbrushes. Infection by bacteria (bacterial). Bacterial pharyngitis may be spread by touching the nose or face after coming in contact with the bacteria, or through close contact, such as kissing. Allergies. Allergies can cause buildup of mucus in the throat (post-nasal drip), leading to inflammation and irritation. Allergies can also cause blocked nasal passages, forcing breathing through the mouth, which dries and irritates the throat. What increases the risk? You are more likely to develop this condition if: You are 85-29 years old. You are exposed to crowded environments such as daycare, school, or dormitory living. You live in a cold climate. You have a weakened disease-fighting (  immune) system. What are the signs or symptoms? Symptoms of this condition vary by the cause. Common symptoms of this condition include: Sore throat. Fatigue. Low-grade fever. Stuffy nose (nasal congestion) and cough. Headache. Other symptoms may include: Glands in the neck (lymph nodes) that are swollen. Skin rashes. Plaque-like film on the throat or tonsils. This is often a symptom of bacterial  pharyngitis. Vomiting. Red, itchy eyes (conjunctivitis). Loss of appetite. Joint pain and muscle aches. Enlarged tonsils. How is this diagnosed? This condition may be diagnosed based on your medical history and a physical exam. Your health care provider will ask you questions about your illness and your symptoms. A swab of your throat may be done to check for bacteria (rapid strep test). Other lab tests may also be done, depending on the suspected cause, but these are rare. How is this treated? Many times, treatment is not needed for this condition. Pharyngitis usually gets better in 3-4 days without treatment. Bacterial pharyngitis may be treated with antibiotic medicines. Follow these instructions at home: Medicines Take over-the-counter and prescription medicines only as told by your health care provider. If you were prescribed an antibiotic medicine, take it as told by your health care provider. Do not stop taking the antibiotic even if you start to feel better. Use throat sprays to soothe your throat as told by your health care provider. Children can get pharyngitis. Do not give your child aspirin because of the association with Reye's syndrome. Managing pain To help with pain, try: Sipping warm liquids, such as broth, herbal tea, or warm water. Eating or drinking cold or frozen liquids, such as frozen ice pops. Gargling with a mixture of salt and water 3-4 times a day or as needed. To make salt water, completely dissolve -1 tsp (3-6 g) of salt in 1 cup (237 mL) of warm water. Sucking on hard candy or throat lozenges. Putting a cool-mist humidifier in your bedroom at night to moisten the air. Sitting in the bathroom with the door closed for 5-10 minutes while you run hot water in the shower.  General instructions  Do not use any products that contain nicotine or tobacco. These products include cigarettes, chewing tobacco, and vaping devices, such as e-cigarettes. If you need help  quitting, ask your health care provider. Rest as told by your health care provider. Drink enough fluid to keep your urine pale yellow. How is this prevented? To help prevent becoming infected or spreading infection: Wash your hands often with soap and water for at least 20 seconds. If soap and water are not available, use hand sanitizer. Do not touch your eyes, nose, or mouth with unwashed hands, and wash hands after touching these areas. Do not share cups or eating utensils. Avoid close contact with people who are sick. Contact a health care provider if: You have large, tender lumps in your neck. You have a rash. You cough up green, yellow-brown, or bloody mucus. Get help right away if: Your neck becomes stiff. You drool or are unable to swallow liquids. You cannot drink or take medicines without vomiting. You have severe pain that does not go away, even after you take medicine. You have trouble breathing, and it is not caused by a stuffy nose. You have new pain and swelling in your joints such as the knees, ankles, wrists, or elbows. These symptoms may represent a serious problem that is an emergency. Do not wait to see if the symptoms will go away. Get medical help right away.  Call your local emergency services (911 in the [place] Do not drive yourself to the hospital. Summary Pharyngitis is redness, pain, and swelling (inflammation) of the throat (pharynx). While pharyngitis can be caused by a bacteria, the most common causes are viral. Most cases of pharyngitis get better on their own without treatment. Bacterial pharyngitis is treated with antibiotic medicines. This information is not intended to replace advice given to you by your health care provider. Make sure you discuss any questions you have with your health care provider. Document Revised: 07/16/2020 Document Reviewed: 07/16/2020 Elsevier Patient Education  2024 Elsevier Inc.   If you have been instructed to have an  in-person evaluation today at a local Urgent Care facility, please use the link below. It will take you to a list of all of our available [facility], including address, phone number and hours of operation. Please do not delay care.  [facility]  If you or a family member do not have a primary care provider, use the link below to schedule a visit and establish care. When you choose a [facility] primary care physician or advanced practice provider, you gain a long-term partner in health. Find a Primary Care Provider  Learn more about [facility] in-office and virtual care options:  - Get Care Now

## 2023-09-13 NOTE — Progress Notes (Signed)
 Virtual Visit Consent   Gloria Estes, you are scheduled for a virtual visit with a [facility] provider today. Just as with appointments in the office, your consent must be obtained to participate. Your consent will be active for this visit and any virtual visit you may have with one of our providers in the next 365 days. If you have a MyChart account, a copy of this consent can be sent to you electronically.  As this is a virtual visit, video technology does not allow for your provider to perform a traditional examination. This may limit your provider's ability to fully assess your condition. If your provider identifies any concerns that need to be evaluated in person or the need to arrange testing (such as labs, EKG, etc.), we will make arrangements to do so. Although advances in technology are sophisticated, we cannot ensure that it will always work on either your end or our end. If the connection with a video visit is poor, the visit may have to be switched to a telephone visit. With either a video or telephone visit, we are not always able to ensure that we have a secure connection.  By engaging in this virtual visit, you consent to the provision of healthcare and authorize for your insurance to be billed (if applicable) for the services provided during this visit. Depending on your insurance coverage, you may receive a charge related to this service.  I need to obtain your verbal consent now. Are you willing to proceed with your visit today? Gloria Estes has provided verbal consent on 09/13/2023 for a virtual visit (video or telephone). Angelia Kelp, PA-C  Date: 09/13/2023 7:40 PM   Virtual Visit via Video Note   I, Angelia Kelp, connected with  Gloria Estes  ([id number], May 11, 1995) on 09/13/23 at  7:15 PM EDT by a video-enabled telemedicine application and verified that I am speaking with the correct person using two identifiers.  Location: Patient: Virtual Visit Location  Patient: Home Provider: Virtual Visit Location Provider: Home Office   I discussed the limitations of evaluation and management by telemedicine and the availability of in person appointments. The patient expressed understanding and agreed to proceed.    History of Present Illness: Gloria Estes is a 28 y.o. who identifies as a female who was assigned female at birth, and is being seen today for sore throat.  HPI: Sore Throat  This is a new problem. The current episode started in the past 7 days (3 days). The problem has been gradually worsening. There has been no fever. The pain is moderate. Associated symptoms include congestion, swollen glands, trouble swallowing and vomiting (mucus). Pertinent negatives include no coughing, diarrhea, drooling, ear discharge, ear pain, headaches, hoarse voice, plugged ear sensation or shortness of breath. She has had no exposure to strep. She has tried NSAIDs and acetaminophen  (ibuprofen ) for the symptoms. The treatment provided mild relief.      Problems:  Patient Active Problem List   Diagnosis Date Noted   Uterine contractions during pregnancy 05/31/2023   Previous cesarean delivery, antepartum 01/11/2023   Placenta previa antepartum in second trimester 01/11/2023   Preterm premature rupture of membranes 04/29/2021   Previous child with cardiac abnormality, antepartum, second trimester 02/11/2021    Allergies:  Allergies  Allergen Reactions   Tape Itching and Other (See Comments)   Medications:  Current Outpatient Medications:    amoxicillin  (AMOXIL ) 500 MG capsule, Take 1 capsule (500 mg total) by mouth 2 (  two) times daily for 10 days., Disp: 20 capsule, Rfl: 0   acetaminophen  (TYLENOL ) 500 MG tablet, Take 2 tablets (1,000 mg total) by mouth every 8 (eight) hours as needed., Disp: 30 tablet, Rfl: 0   ferrous gluconate  (FERGON) 324 MG tablet, Take 1 tablet (324 mg total) by mouth daily with breakfast., Disp: 30 tablet, Rfl: 0   FLOVENT  HFA 220  MCG/ACT inhaler, Inhale 2 puffs into the lungs 2 (two) times daily. Needs a refill on this (Patient not taking: Reported on 02/10/2023), Disp: , Rfl:    fluticasone  (FLONASE ) 50 MCG/ACT nasal spray, Place 1 spray into both nostrils daily. (Patient not taking: Reported on 02/10/2023), Disp: 11.1 mL, Rfl: 2   ibuprofen  (ADVIL ) 600 MG tablet, Take 1 tablet (600 mg total) by mouth every 6 (six) hours as needed., Disp: 30 tablet, Rfl: 0   Prenatal 28-0.8 MG TABS, Take 1 tablet by mouth daily., Disp: , Rfl:   Observations/Objective: Patient is well-developed, well-nourished in no acute distress.  Resting comfortably at home.  Head is normocephalic, atraumatic.  No labored breathing.  Speech is clear and coherent with logical content.  Patient is alert and oriented at baseline.    Assessment and Plan: 1. Acute bacterial pharyngitis (Primary) - amoxicillin  (AMOXIL ) 500 MG capsule; Take 1 capsule (500 mg total) by mouth 2 (two) times daily for 10 days.  Dispense: 20 capsule; Refill: 0  - Suspect strep throat - Amoxicillin  prescribed - Tylenol  and Ibuprofen  alternating every 4 hours - Salt water gargles - Chloraseptic spray - Liquid and soft food diet - Push fluids - New toothbrush in 3 days - Seek in person evaluation if not improving or if symptoms worsen   Follow Up Instructions: I discussed the assessment and treatment plan with the patient. The patient was provided an opportunity to ask questions and all were answered. The patient agreed with the plan and demonstrated an understanding of the instructions.  A copy of instructions were sent to the patient via MyChart unless otherwise noted below.    The patient was advised to call back or seek an in-person evaluation if the symptoms worsen or if the condition fails to improve as anticipated.    Angelia Kelp, PA-C

## 2023-09-19 DIAGNOSIS — F4323 Adjustment disorder with mixed anxiety and depressed mood: Secondary | ICD-10-CM | POA: Diagnosis not present

## 2023-09-29 DIAGNOSIS — F4323 Adjustment disorder with mixed anxiety and depressed mood: Secondary | ICD-10-CM | POA: Diagnosis not present

## 2023-10-05 DIAGNOSIS — F4323 Adjustment disorder with mixed anxiety and depressed mood: Secondary | ICD-10-CM | POA: Diagnosis not present

## 2023-10-12 DIAGNOSIS — F4323 Adjustment disorder with mixed anxiety and depressed mood: Secondary | ICD-10-CM | POA: Diagnosis not present

## 2023-10-19 DIAGNOSIS — F4323 Adjustment disorder with mixed anxiety and depressed mood: Secondary | ICD-10-CM | POA: Diagnosis not present

## 2023-10-25 DIAGNOSIS — F4323 Adjustment disorder with mixed anxiety and depressed mood: Secondary | ICD-10-CM | POA: Diagnosis not present

## 2023-11-02 DIAGNOSIS — F4323 Adjustment disorder with mixed anxiety and depressed mood: Secondary | ICD-10-CM | POA: Diagnosis not present

## 2023-11-09 DIAGNOSIS — F4323 Adjustment disorder with mixed anxiety and depressed mood: Secondary | ICD-10-CM | POA: Diagnosis not present

## 2023-11-23 DIAGNOSIS — Z113 Encounter for screening for infections with a predominantly sexual mode of transmission: Secondary | ICD-10-CM | POA: Diagnosis not present

## 2023-11-23 DIAGNOSIS — R102 Pelvic and perineal pain: Secondary | ICD-10-CM | POA: Diagnosis not present

## 2023-11-23 DIAGNOSIS — Z01419 Encounter for gynecological examination (general) (routine) without abnormal findings: Secondary | ICD-10-CM | POA: Diagnosis not present

## 2023-11-23 DIAGNOSIS — F4323 Adjustment disorder with mixed anxiety and depressed mood: Secondary | ICD-10-CM | POA: Diagnosis not present

## 2023-11-23 DIAGNOSIS — N898 Other specified noninflammatory disorders of vagina: Secondary | ICD-10-CM | POA: Diagnosis not present

## 2023-11-30 DIAGNOSIS — F4323 Adjustment disorder with mixed anxiety and depressed mood: Secondary | ICD-10-CM | POA: Diagnosis not present

## 2023-12-01 DIAGNOSIS — R102 Pelvic and perineal pain: Secondary | ICD-10-CM | POA: Diagnosis not present

## 2023-12-07 DIAGNOSIS — F4323 Adjustment disorder with mixed anxiety and depressed mood: Secondary | ICD-10-CM | POA: Diagnosis not present

## 2023-12-14 DIAGNOSIS — F4323 Adjustment disorder with mixed anxiety and depressed mood: Secondary | ICD-10-CM | POA: Diagnosis not present

## 2023-12-21 DIAGNOSIS — F4323 Adjustment disorder with mixed anxiety and depressed mood: Secondary | ICD-10-CM | POA: Diagnosis not present

## 2024-01-04 DIAGNOSIS — F4323 Adjustment disorder with mixed anxiety and depressed mood: Secondary | ICD-10-CM | POA: Diagnosis not present

## 2024-01-12 DIAGNOSIS — F4323 Adjustment disorder with mixed anxiety and depressed mood: Secondary | ICD-10-CM | POA: Diagnosis not present

## 2024-01-25 DIAGNOSIS — F4323 Adjustment disorder with mixed anxiety and depressed mood: Secondary | ICD-10-CM | POA: Diagnosis not present

## 2024-02-01 DIAGNOSIS — F4323 Adjustment disorder with mixed anxiety and depressed mood: Secondary | ICD-10-CM | POA: Diagnosis not present

## 2024-02-15 DIAGNOSIS — F4323 Adjustment disorder with mixed anxiety and depressed mood: Secondary | ICD-10-CM | POA: Diagnosis not present

## 2024-02-23 DIAGNOSIS — F4323 Adjustment disorder with mixed anxiety and depressed mood: Secondary | ICD-10-CM | POA: Diagnosis not present

## 2024-02-28 ENCOUNTER — Encounter

## 2024-03-07 DIAGNOSIS — F4323 Adjustment disorder with mixed anxiety and depressed mood: Secondary | ICD-10-CM | POA: Diagnosis not present

## 2024-03-14 DIAGNOSIS — F4323 Adjustment disorder with mixed anxiety and depressed mood: Secondary | ICD-10-CM | POA: Diagnosis not present

## 2024-04-11 DIAGNOSIS — F4323 Adjustment disorder with mixed anxiety and depressed mood: Secondary | ICD-10-CM | POA: Diagnosis not present

## 2024-04-18 DIAGNOSIS — F4323 Adjustment disorder with mixed anxiety and depressed mood: Secondary | ICD-10-CM | POA: Diagnosis not present

## 2024-04-30 DIAGNOSIS — F4323 Adjustment disorder with mixed anxiety and depressed mood: Secondary | ICD-10-CM | POA: Diagnosis not present
